# Patient Record
Sex: Female | Born: 1940 | Race: White | Hispanic: No | Marital: Married | Smoking: Never smoker
Health system: Southern US, Community
[De-identification: ages and names within clinical notes are randomized; demographics above are authoritative.]

## PROBLEM LIST (undated history)

## (undated) DIAGNOSIS — K635 Polyp of colon: Secondary | ICD-10-CM

## (undated) DIAGNOSIS — K649 Unspecified hemorrhoids: Secondary | ICD-10-CM

## (undated) HISTORY — PX: CATARACT EXTRACTION: SUR2

## (undated) HISTORY — DX: Polyp of colon: K63.5

## (undated) HISTORY — DX: Unspecified hemorrhoids: K64.9

---

## 1996-02-28 HISTORY — PX: ABDOMINAL HYSTERECTOMY: SHX81

## 1997-06-17 ENCOUNTER — Other Ambulatory Visit: Admission: RE | Admit: 1997-06-17 | Discharge: 1997-06-17 | Payer: Self-pay | Admitting: Obstetrics & Gynecology

## 1999-06-27 ENCOUNTER — Other Ambulatory Visit: Admission: RE | Admit: 1999-06-27 | Discharge: 1999-06-27 | Payer: Self-pay | Admitting: Obstetrics & Gynecology

## 2000-07-10 ENCOUNTER — Other Ambulatory Visit: Admission: RE | Admit: 2000-07-10 | Discharge: 2000-07-10 | Payer: Self-pay | Admitting: Family Medicine

## 2001-08-05 ENCOUNTER — Other Ambulatory Visit: Admission: RE | Admit: 2001-08-05 | Discharge: 2001-08-05 | Payer: Self-pay | Admitting: Obstetrics & Gynecology

## 2002-10-09 ENCOUNTER — Other Ambulatory Visit: Admission: RE | Admit: 2002-10-09 | Discharge: 2002-10-09 | Payer: Self-pay | Admitting: Obstetrics & Gynecology

## 2003-11-11 ENCOUNTER — Other Ambulatory Visit: Admission: RE | Admit: 2003-11-11 | Discharge: 2003-11-11 | Payer: Self-pay | Admitting: Obstetrics & Gynecology

## 2003-12-30 ENCOUNTER — Ambulatory Visit: Payer: Self-pay | Admitting: Internal Medicine

## 2004-01-19 ENCOUNTER — Ambulatory Visit: Payer: Self-pay | Admitting: Internal Medicine

## 2004-04-27 ENCOUNTER — Ambulatory Visit (HOSPITAL_BASED_OUTPATIENT_CLINIC_OR_DEPARTMENT_OTHER): Admission: RE | Admit: 2004-04-27 | Discharge: 2004-04-27 | Payer: Self-pay | Admitting: Orthopedic Surgery

## 2004-04-27 ENCOUNTER — Ambulatory Visit (HOSPITAL_COMMUNITY): Admission: RE | Admit: 2004-04-27 | Discharge: 2004-04-27 | Payer: Self-pay | Admitting: Orthopedic Surgery

## 2004-04-27 ENCOUNTER — Encounter (INDEPENDENT_AMBULATORY_CARE_PROVIDER_SITE_OTHER): Payer: Self-pay | Admitting: Specialist

## 2004-11-16 ENCOUNTER — Other Ambulatory Visit: Admission: RE | Admit: 2004-11-16 | Discharge: 2004-11-16 | Payer: Self-pay | Admitting: Obstetrics & Gynecology

## 2006-03-19 ENCOUNTER — Encounter: Admission: RE | Admit: 2006-03-19 | Discharge: 2006-03-19 | Payer: Self-pay | Admitting: Obstetrics & Gynecology

## 2009-06-11 ENCOUNTER — Encounter (INDEPENDENT_AMBULATORY_CARE_PROVIDER_SITE_OTHER): Payer: Self-pay | Admitting: *Deleted

## 2009-07-09 DIAGNOSIS — K648 Other hemorrhoids: Secondary | ICD-10-CM | POA: Insufficient documentation

## 2009-07-15 ENCOUNTER — Ambulatory Visit: Payer: Self-pay | Admitting: Internal Medicine

## 2009-07-15 DIAGNOSIS — M129 Arthropathy, unspecified: Secondary | ICD-10-CM | POA: Insufficient documentation

## 2009-07-15 DIAGNOSIS — K589 Irritable bowel syndrome without diarrhea: Secondary | ICD-10-CM | POA: Insufficient documentation

## 2009-07-20 LAB — CONVERTED CEMR LAB: Tissue Transglutaminase Ab, IgA: 2.1 units (ref <20)

## 2010-03-29 NOTE — Assessment & Plan Note (Signed)
Summary: hemorroids--ch.                            (11:15 appt)   History of Present Illness Visit Type: Initial Consult Primary GI MD: Lina Sar MD Primary Provider: Sigmund Hazel, MD Requesting Provider: Varney Baas , MD Chief Complaint: Patient complains about alot of gas an rumbling in her stomach. She also complains of a small hemorrhoid that does bleed from time to time. She complains of some rectal fullness and pressure but she denies constipation or diarrhea.  History of Present Illness:   This is a 70 year old white female complaining of excessive  gurgling and a lot of noise in her stomach. It becomes a problem during [employer] where there is a long silence and she's embarrassed by the noises coming from her  stomach .She denies any abdominal pain. Her bowel habits are regular. There is no nausea, vomiting, heartburn or weight changes. Her diet has been healthy. She has a history of internal hemorrhoids and is status post colonoscopy in November 2005 showing grade 2 hemorrhoids. He denies lactose intolerance or wheat intolerance.   GI Review of Systems    Reports belching and  bloating.      Denies abdominal pain, acid reflux, chest pain, dysphagia with liquids, dysphagia with solids, heartburn, loss of appetite, nausea, vomiting, vomiting blood, weight loss, and  weight gain.      Reports hemorrhoids, rectal bleeding, and  rectal pain.     Denies anal fissure, black tarry stools, change in bowel habit, constipation, diarrhea, diverticulosis, fecal incontinence, heme positive stool, irritable bowel syndrome, jaundice, light color stool, and  liver problems. Preventive Screening-Counseling & Management  Alcohol-Tobacco     Smoking Status: never    Current Medications (verified): 1)  Premarin 0.625 Mg/gm Crea (Estrogens, Conjugated) .... Apply As Directed 2)  Triamterene-Hctz 37.5-25 Mg Tabs (Triamterene-Hctz) .... Take 1 Tablet By Mouth Once A Day 3)  Calcium 1200-1000  Mg-Unit Chew (Calcium Carbonate-Vit D-Min) .... Take Daily 4)  Fish Oil 1000 Mg/omega 3 300 Mg Capsule .... Take 1 Capsule By Mouth Once A Day 5)  Fiber Diet  Tabs (Fiber) .... Take Two Tabs By Mouth Once Daily 6)  Multivitamins  Tabs (Multiple Vitamin) .... Take 1 Tablet By Mouth Once A Day 7)  Vitamin D3 2000 Unit Caps (Cholecalciferol) .... Take 1 Capsule By Mouth Once A Day 8)  Aspirin 81 Mg Tbec (Aspirin) .... Take 1 Tablet By Mouth Once A Day 9)  Ferrous Sulfate 325 (65 Fe) Mg Tabs (Ferrous Sulfate) .... Take One By Mouth Once Daily 10)  Ginkgo Biloba 120 Mg Caps (Ginkgo Biloba) .... Take One By Mouth Once Daily 11)  Acai Berry 500 Mg Caps (Acai) .... Take Two By Mouth Once Daily 12)  Beano  Tabs (Alpha-D-Galactosidase) .... Take Two Tabs By Mouth Sundays 13)  Phillips Colon Health  Caps (Probiotic Product) .... 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 C-Section Hysterectomy  Family History: Family History of Breast Cancer: Mother, Aunt, neice Family History of Colon Cancer: Paternal Aunt Family History of Diabetes: sister, neice Family History of Heart Disease: Brother, Actor, Father Family History of Liver Cancer: Aunt  Social History: retired married with one son Alcohol Use - yes  wine once every 3 weeks Illicit Drug Use - no Daily Caffeine Use 2 per day Patient has never smoked.  Smoking Status:  never  Review of Systems       The patient complains of allergy/sinus, arthritis/joint pain, fatigue, hearing problems, muscle pains/cramps, and swelling of feet/legs.  The patient denies anemia, anxiety-new, back pain, blood in urine, breast changes/lumps, change in vision, confusion, cough, coughing up blood,  depression-new, fainting, fever, headaches-new, heart murmur, heart rhythm changes, itching, menstrual pain, night sweats, nosebleeds, pregnancy symptoms, shortness of breath, skin rash, sleeping problems, sore throat, swollen lymph glands, thirst - excessive , urination - excessive , urination changes/pain, urine leakage, vision changes, and voice change.         Pertinent positive and negative review of systems were noted in the above HPI. All other ROS was otherwise negative.   Vital Signs:  Patient profile:   70 year old female Height:      63 inches Weight:      136.6 pounds BMI:     24.29 Pulse rate:   64 / minute Pulse rhythm:   regular BP sitting:   110 / 70  (right arm) Cuff size:   regular  Vitals Entered By: Harlow Mares CMA Duncan Dull) (Jul 15, 2009 11:32 AM)  Physical Exam  General:  Well developed, well nourished, no acute distress. Mouth:  No deformity or lesions, dentition normal. Neck:  Supple; no masses or thyromegaly. Lungs:  Clear throughout to auscultation. Heart:  Regular rate and rhythm; no murmurs, rubs,  or bruits. Abdomen:  normoactive bowel sounds. No abnormal sounds. No tenderness. No distention. No tympany Rectal:  external hemorrhoids stool is soft Hemoccult negative Extremities:  No clubbing, cyanosis, edema or deformities noted. Skin:  Intact without significant lesions or rashes. Psych:  Alert and cooperative. Normal mood and affect.   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 I am not sure of the etiology of it but there are several suggestions. One is to decrease her caffeine intake in the morning which stimulates acid production  as well as peristalsis. We have also given her samples of Prilosec 20 mg daily for 10 days . We will also give her a trial of Bentyl 20 mg to take in the morning before the  meeting to decrease her small bowel motility. She has already tried probiotics. She denies any other associated symptoms such as abdominal pain or weight changes. We will check her tissue transglutaminase. 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 She may need surgical intervention in the future but currently, her hemorrhoids are under good control. Orders: TLB-TSH (Thyroid Stimulating Hormone) (84443-TSH) T-Tissue Transglutamase Ab IgA ([id number])  Patient Instructions: 1)  high-fiber diet. 2)  Reduce lactulose. 3)  Continue probiotic. 4)  Trial of Prilosec 20 mg daily for 10 days. 5)  Bentyl 20 mg p.o. q.a.m. p.r.n. hyperactive bowel sounds. 6)  Tissue transglutaminase and TSH. 7)  The medication list was reviewed and reconciled.  All changed / newly prescribed medications were explained.  A complete medication list was provided to the patient / caregiver. Prescriptions: BENTYL 20 MG TABS (DICYCLOMINE HCL) Take 1 tablet by mouth every day as needed  #30 x 1   Entered by:   Lamona Curl CMA (AAMA)   Authorized by:   Hart Carwin MD  Signed by:   Lamona Curl CMA (AAMA) on 07/15/2009   Method used:   Electronically to        [facility] [phone]* (retail)       [address]       [place], [place]  [address]       Ph: [phone]       Fax: [phone]   RxID:   [id number]

## 2010-03-29 NOTE — Letter (Signed)
Summary: New Patient letter  [facility]  [address] [place], [place] [address]   Phone: [phone]  Fax: [phone]       06/11/2009 MRN: [id number]  Jaime Lindsey [address] [place], [place]  [address]  Dear Ms. Jaime Lindsey,  Welcome to the Gastroenterology Division at [facility].    You are scheduled to see Dr. Juanda Chance on 08-02-09 at 1:30p.m. on the 3rd floor at [facility], [address]  We ask that you try to arrive at our office 15 minutes prior to your appointment time to allow for check-in.  We would like you to complete the enclosed self-administered evaluation form prior to your visit and bring it with you on the day of your appointment.  We will review it with you.  Also, please bring a complete list of all your medications or, if you prefer, bring the medication bottles and we will list them.  Please bring your insurance card so that we may make a copy of it.  If your insurance requires a referral to see a specialist, please bring your referral form from your primary care physician.  Co-payments are due at the time of your visit and may be paid by cash, check or credit card.     Your office visit will consist of a consult with your physician (includes a physical exam), any laboratory testing he/she may order, scheduling of any necessary diagnostic testing (e.g. x-ray, ultrasound, CT-scan), and scheduling of a procedure (e.g. Endoscopy, Colonoscopy) if required.  Please allow enough time on your schedule to allow for any/all of these possibilities.    If you cannot keep your appointment, please call [phone] to cancel or reschedule prior to your appointment date.  This allows [place] the opportunity to schedule an appointment for another patient in need of care.  If you do not cancel or reschedule by 5 p.m. the business day prior to your appointment date, you will be charged a $50.00 late cancellation/no-show fee.    Thank you for choosing  [facility] for your medical needs.  We appreciate the opportunity to care for you.  Please visit [place] at our website  to learn more about our practice.                     Sincerely,                                                             The Gastroenterology Division

## 2010-03-29 NOTE — Procedures (Signed)
Summary: COLON   Colonoscopy  Procedure date:  01/19/2004  Findings:      Location:  [facility].    Procedures Next Due Date:    Colonoscopy: 01/2014 Patient Name: Jaime Lindsey, Jaime Lindsey MRN:  Procedure Procedures: Colonoscopy CPT: 16109.    with biopsy. CPT: Q5068410.  Personnel: Endoscopist: Chaunice Obie L. Juanda Chance, MD.  Referred By: Freddy Finner, M.D.  Exam Location: Exam performed in Outpatient Clinic. Outpatient  Patient Consent: Procedure, Alternatives, Risks and Benefits discussed, consent obtained, from patient. Consent was obtained by the RN.  Indications Symptoms: Abdominal pain / bloating. Change in bowel habits.  History  Current Medications: Patient is not currently taking Coumadin.  Pre-Exam Physical: Performed Jan 19, 2004. Entire physical exam was normal.  Exam Exam: Extent of exam reached: Ileum, extent intended: Cecum.  The cecum was identified by appendiceal orifice and IC valve. Colon retroflexion performed. Images taken. ASA Classification: I. Tolerance: good.  Monitoring: Pulse and BP monitoring, Oximetry used. Supplemental O2 given.  Colon Prep Used Miralax for colon prep. Prep results: good.  Sedation Meds: Patient assessed and found to be appropriate for moderate (conscious) sedation. Fentanyl 100 mcg. given IV. Versed 10 mg. given IV.  Findings - HEMORRHOIDS: Internal. Size: Grade II. ICD9: Hemorrhoids, Internal: 455.0. Comments: prolapsing hemorrhoid with some rectal tissue, mucosal hemorrhages.   Assessment Abnormal examination, see findings above.  Diagnoses: 455.0: Hemorrhoids, Internal.   Comments: second degree hemorrhoids Events  Unplanned Interventions: No intervention was required.  Unplanned Events: There were no complications. Plans Medication Plan: Hemorrhoidal Medications: Anusol Suppositories 1 HS, starting Jan 19, 2004  Hemorrhoidal Medications: Benefiber 1 Tablesp, starting Jan 19, 2004   Patient  Education: Patient given standard instructions for: Yearly hemoccult testing recommended. Patient instructed to get routine colonoscopy every 10 years.  Disposition: After procedure patient sent to recovery. After recovery patient sent home.   This report was created from the original endoscopy report, which was reviewed and signed by the above listed endoscopist.

## 2010-07-15 NOTE — Op Note (Signed)
NAMESHAUNESSY, DOBRATZ NO.:  [id number]   MEDICAL RECORD NO.:  [id number]          PATIENT TYPE:  AMB   LOCATION:  [facility]                          FACILITY:  [facility]   PHYSICIAN:  Cindee Salt, M.D.       DATE OF BIRTH:  1940/03/29   DATE OF PROCEDURE:  04/27/2004  DATE OF DISCHARGE:                                 OPERATIVE REPORT   PREOPERATIVE DIAGNOSIS:  Mucoid cyst, right middle finger.   POSTOPERATIVE DIAGNOSIS:  Mucoid cyst, right middle finger.   OPERATION:  Excision cyst, debridement of distal phalangeal joint, right  middle finger.   SURGEON:  Cindee Salt, M.D.   ASSISTANTCarolyne Fiscal.   ANESTHESIA:  Forearm based IV regional.   INDICATIONS FOR PROCEDURE:  The patient is a 70 year old female with a  history of a mucoid cyst, right middle finger. This has some caused  translucency of the skin in a small punctate area.   PROCEDURE:  The patient is brought to the operating room where a forearm-  based IV regional anesthetic was carried out without difficulty. She was  prepped using DuraPrep in the supine position with the right arm free. A  curvilinear incision was made over the distal interphalangeal joint carried  down through subcutaneous tissue. The incision made just proximal to the  area of the cyst. This was identified, undermined, and the cyst excised.  The joint was opened.  A large exostosis was removed from the proximal  phalanx. A significant synovitis with cyst formation radially and ulnarly  were removed. No further lesions were identified. The wound was copiously  irrigated with saline. The skin was then closed with interrupted #5-0 nylon  sutures. A sterile compressive dressing and splint were applied.   The patient tolerated the procedure well was taken to the recovery room for  observation, in satisfactory condition.   DISPOSITION:  She is discharged home, to return to the [facility] in one week, on Vicodin.      GK/MEDQ   D:  04/27/2004  T:  04/27/2004  Job:  [id number]

## 2012-04-04 ENCOUNTER — Other Ambulatory Visit: Payer: Self-pay | Admitting: Family Medicine

## 2012-04-04 ENCOUNTER — Other Ambulatory Visit: Payer: Self-pay | Admitting: Obstetrics & Gynecology

## 2012-04-04 DIAGNOSIS — Z1231 Encounter for screening mammogram for malignant neoplasm of breast: Secondary | ICD-10-CM

## 2012-05-14 ENCOUNTER — Ambulatory Visit: Payer: Self-pay

## 2012-11-06 ENCOUNTER — Other Ambulatory Visit: Payer: Self-pay | Admitting: Dermatology

## 2013-10-30 ENCOUNTER — Encounter: Payer: Self-pay | Admitting: Internal Medicine

## 2014-06-02 ENCOUNTER — Encounter: Payer: Self-pay | Admitting: Internal Medicine

## 2014-06-30 ENCOUNTER — Other Ambulatory Visit: Payer: Self-pay | Admitting: Obstetrics & Gynecology

## 2014-07-06 ENCOUNTER — Other Ambulatory Visit: Payer: Self-pay | Admitting: Obstetrics & Gynecology

## 2014-07-06 DIAGNOSIS — N6320 Unspecified lump in the left breast, unspecified quadrant: Secondary | ICD-10-CM

## 2014-07-09 ENCOUNTER — Ambulatory Visit
Admission: RE | Admit: 2014-07-09 | Discharge: 2014-07-09 | Disposition: A | Discharge status: Home / Self Care | Payer: Medicare Other | Source: Ambulatory Visit | Attending: Obstetrics & Gynecology | Admitting: Obstetrics & Gynecology

## 2014-07-09 DIAGNOSIS — N6320 Unspecified lump in the left breast, unspecified quadrant: Secondary | ICD-10-CM

## 2015-02-04 ENCOUNTER — Other Ambulatory Visit: Payer: Self-pay | Admitting: Obstetrics & Gynecology

## 2015-02-04 DIAGNOSIS — N6320 Unspecified lump in the left breast, unspecified quadrant: Secondary | ICD-10-CM

## 2015-02-10 ENCOUNTER — Ambulatory Visit
Admission: RE | Admit: 2015-02-10 | Discharge: 2015-02-10 | Disposition: A | Discharge status: Home / Self Care | Payer: Medicare Other | Source: Ambulatory Visit | Attending: Obstetrics & Gynecology | Admitting: Obstetrics & Gynecology

## 2015-02-10 DIAGNOSIS — N6320 Unspecified lump in the left breast, unspecified quadrant: Secondary | ICD-10-CM

## 2015-06-08 ENCOUNTER — Other Ambulatory Visit: Payer: Self-pay

## 2015-06-08 DIAGNOSIS — Z1231 Encounter for screening mammogram for malignant neoplasm of breast: Secondary | ICD-10-CM

## 2015-06-30 ENCOUNTER — Ambulatory Visit
Admission: RE | Admit: 2015-06-30 | Discharge: 2015-06-30 | Disposition: A | Discharge status: Home / Self Care | Payer: Medicare Other | Source: Ambulatory Visit

## 2015-06-30 DIAGNOSIS — Z1231 Encounter for screening mammogram for malignant neoplasm of breast: Secondary | ICD-10-CM

## 2017-08-22 ENCOUNTER — Other Ambulatory Visit: Payer: Self-pay | Admitting: Obstetrics & Gynecology

## 2017-08-22 DIAGNOSIS — R928 Other abnormal and inconclusive findings on diagnostic imaging of breast: Secondary | ICD-10-CM

## 2017-08-23 ENCOUNTER — Other Ambulatory Visit: Payer: Self-pay | Admitting: Obstetrics & Gynecology

## 2017-08-23 ENCOUNTER — Ambulatory Visit
Admission: RE | Admit: 2017-08-23 | Discharge: 2017-08-23 | Disposition: A | Discharge status: Home / Self Care | Payer: Medicare Other | Source: Ambulatory Visit | Attending: Obstetrics & Gynecology | Admitting: Obstetrics & Gynecology

## 2017-08-23 DIAGNOSIS — R921 Mammographic calcification found on diagnostic imaging of breast: Secondary | ICD-10-CM

## 2017-08-23 DIAGNOSIS — R928 Other abnormal and inconclusive findings on diagnostic imaging of breast: Secondary | ICD-10-CM

## 2017-09-06 ENCOUNTER — Ambulatory Visit
Admission: RE | Admit: 2017-09-06 | Discharge: 2017-09-06 | Disposition: A | Discharge status: Home / Self Care | Payer: Medicare Other | Source: Ambulatory Visit | Attending: Obstetrics & Gynecology | Admitting: Obstetrics & Gynecology

## 2017-09-06 DIAGNOSIS — R921 Mammographic calcification found on diagnostic imaging of breast: Secondary | ICD-10-CM

## 2017-10-08 ENCOUNTER — Ambulatory Visit: Payer: Medicare Other | Admitting: Cardiology

## 2017-10-08 ENCOUNTER — Encounter: Payer: Self-pay | Admitting: Cardiology

## 2017-10-08 DIAGNOSIS — I10 Essential (primary) hypertension: Secondary | ICD-10-CM | POA: Diagnosis not present

## 2017-10-08 DIAGNOSIS — R5383 Other fatigue: Secondary | ICD-10-CM

## 2017-10-08 DIAGNOSIS — R001 Bradycardia, unspecified: Secondary | ICD-10-CM | POA: Insufficient documentation

## 2017-10-08 NOTE — Assessment & Plan Note (Signed)
R/O cardiac etiology

## 2017-10-08 NOTE — Patient Instructions (Signed)
Medication Instructions: Your physician recommends that you continue on your current medications as directed. Please refer to the Current Medication list given to you today.   Testing/Procedures: Your physician has requested that you have an exercise stress myoview. For further information please visit [email or web address]. Please follow instruction sheet, as given.  Follow-Up: Your physician recommends that you schedule a follow-up appointment with Dr. Stanford Breed after testing.

## 2017-10-08 NOTE — Progress Notes (Signed)
10/08/2017 Jaime Lindsey   04/07/1940  [id number]  Primary Physician Kathyrn Lass, MD Primary Cardiologist: Dr Stanford Breed (new)  HPI:  Pleasant 77 y/o female referred to [place] for evaluation  of fatigue. The pt has no history of CAD or prior cardiac work up. She has HTN which is treated.  She has noticed general fatigue over the pat 6-8 months. She exercises each morning using hand weights and stretches without difficulty. During the day routine chores such as shopping and laundry cause significant fatigue. She has had rare near syncope, twice in the last 6 months. Theses episodes may have been associated with tachycardia. She denies any chest pain or DOE.    Current Outpatient Medications  Medication Sig Dispense Refill  . Calcium Carbonate-Vit D-Min (CALCIUM 1200 PO) Take 1 tablet by mouth daily.    . Cholecalciferol (VITAMIN D3) 2000 units TABS Take 1 tablet by mouth daily.    Marland Kitchen estradiol (ESTRACE) 0.5 MG tablet Take 1 mg by mouth daily.   3  . Ferrous Sulfate (IRON) 325 (65 Fe) MG TABS Take 1 tablet by mouth daily.    . Inulin (FIBER CHOICE PO) Take 1 tablet by mouth daily.    . Multiple Vitamin (MULTIVITAMIN) tablet Take 1 tablet by mouth daily.    Marland Kitchen triamterene-hydrochlorothiazide (DYAZIDE) 37.5-25 MG capsule Take 1 capsule by mouth daily.    . vitamin B-12 (CYANOCOBALAMIN) 1000 MCG tablet Take 1,000 mcg by mouth daily.     No current facility-administered medications for this visit.     Not on File  Past Medical History:  Diagnosis Date  . Colonic polyp   . Hemorrhoids     Social History   Socioeconomic History  . Marital status: Married    Spouse name: Not on file  . Number of children: Not on file  . Years of education: Not on file  . Highest education level: Not on file  Occupational History  . Not on file  Social Needs  . Financial resource strain: Not on file  . Food insecurity:    Worry: Not on file    Inability: Not on file  . Transportation needs:   Medical: Not on file    Non-medical: Not on file  Tobacco Use  . Smoking status: Never Smoker  . Smokeless tobacco: Never Used  Substance and Sexual Activity  . Alcohol use: Not on file  . Drug use: Never  . Sexual activity: Not on file  Lifestyle  . Physical activity:    Days per week: Not on file    Minutes per session: Not on file  . Stress: Not on file  Relationships  . Social connections:    Talks on phone: Not on file    Gets together: Not on file    Attends religious service: Not on file    Active member of club or organization: Not on file    Attends meetings of clubs or organizations: Not on file    Relationship status: Not on file  . Intimate partner violence:    Fear of current or ex partner: Not on file    Emotionally abused: Not on file    Physically abused: Not on file    Forced sexual activity: Not on file  Other Topics Concern  . Not on file  Social History Narrative  . Not on file     Family History  Problem Relation Age of Onset  . CAD Father 61  CABG in his 77's     Review of Systems: General: negative for chills, fever, night sweats or weight changes.  Cardiovascular: negative for chest pain, dyspnea on exertion, edema, orthopnea, paroxysmal nocturnal dyspnea or shortness of breath Dermatological: negative for rash Respiratory: negative for cough or wheezing Urologic: negative for hematuria Abdominal: negative for nausea, vomiting, diarrhea, bright red blood per rectum, melena, or hematemesis Neurologic: negative for visual changes, syncope, or dizziness All other systems reviewed and are otherwise negative except as noted above.    Blood pressure 122/74, pulse 69, height 5\' 2"  (1.575 m), weight 140 lb 3.2 oz (63.6 kg), SpO2 97 %.  General appearance: alert, cooperative and no distress Neck: no carotid bruit and no JVD Lungs: clear to auscultation bilaterally Heart: regular rate and rhythm Abdomen: soft, non-tender; bowel sounds  normal; no masses,  no organomegaly Extremities: extremities normal, atraumatic, no cyanosis or edema and varicose veins noted Pulses: 2+ and symmetric Skin: Skin color, texture, turgor normal. No rashes or lesions Neurologic: Grossly normal  EKG From PCP 09/07/17- NSR-56 LAFB  ASSESSMENT AND PLAN:   Fatigue R/O cardiac etiology  Essential hypertension Controlled  Sinus bradycardia on ECG HR in the 60's today   PLAN  I suggested we proceed with a GXT Myoview. She can f/u with Dr Stanford Breed after this.   Kerin Ransom PA-C 10/08/2017 3:35 PM

## 2017-10-08 NOTE — Assessment & Plan Note (Signed)
HR in the 60's today

## 2017-10-08 NOTE — Assessment & Plan Note (Signed)
Controlled.  

## 2017-10-10 ENCOUNTER — Telehealth (HOSPITAL_COMMUNITY): Payer: Self-pay

## 2017-10-10 NOTE — Telephone Encounter (Signed)
Encounter complete. 

## 2017-10-11 ENCOUNTER — Ambulatory Visit (HOSPITAL_COMMUNITY)
Admission: RE | Admit: 2017-10-11 | Discharge: 2017-10-11 | Disposition: A | Discharge status: Home / Self Care | Payer: Medicare Other | Source: Ambulatory Visit | Attending: Cardiology | Admitting: Cardiology

## 2017-10-11 ENCOUNTER — Encounter: Payer: Self-pay | Admitting: Cardiology

## 2017-10-11 ENCOUNTER — Encounter (HOSPITAL_COMMUNITY): Payer: Self-pay

## 2017-10-11 DIAGNOSIS — R001 Bradycardia, unspecified: Secondary | ICD-10-CM | POA: Insufficient documentation

## 2017-10-11 DIAGNOSIS — R5383 Other fatigue: Secondary | ICD-10-CM | POA: Insufficient documentation

## 2017-10-12 ENCOUNTER — Telehealth (HOSPITAL_COMMUNITY): Payer: Self-pay

## 2017-10-12 NOTE — Telephone Encounter (Signed)
Encounter complete. 

## 2017-10-16 ENCOUNTER — Ambulatory Visit (HOSPITAL_COMMUNITY)
Admission: RE | Admit: 2017-10-16 | Discharge: 2017-10-16 | Disposition: A | Discharge status: Home / Self Care | Payer: Medicare Other | Source: Ambulatory Visit | Attending: Cardiovascular Disease | Admitting: Cardiovascular Disease

## 2017-10-16 DIAGNOSIS — R5383 Other fatigue: Secondary | ICD-10-CM | POA: Diagnosis present

## 2017-10-16 DIAGNOSIS — R001 Bradycardia, unspecified: Secondary | ICD-10-CM | POA: Insufficient documentation

## 2017-10-16 LAB — MYOCARDIAL PERFUSION IMAGING
Peak HR: 82 {beats}/min
Rest HR: 59 {beats}/min
SDS: 2
SRS: 1
SSS: 3
TID: 1

## 2017-10-16 MED ORDER — TECHNETIUM TC 99M TETROFOSMIN IV KIT
9.9000 | PACK | Freq: Once | INTRAVENOUS | Status: DC | PRN
  Filled 2017-10-16: qty 10

## 2017-10-16 MED ORDER — TECHNETIUM TC 99M TETROFOSMIN IV KIT
30.5000 | PACK | Freq: Once | INTRAVENOUS | Status: DC | PRN
  Filled 2017-10-16: qty 31

## 2017-10-16 MED ORDER — REGADENOSON 0.4 MG/5ML IV SOLN: 0.4000 mg | Freq: Once | INTRAVENOUS | Status: DC

## 2017-12-03 NOTE — Progress Notes (Signed)
HPI: Follow-up fatigue.  Laboratories July 2019 showed mildly elevated TSH at 5.58 and normal hemoglobin.  Seen by Kerin Ransom October 08, 2017 with this complaint.  Had stress nuclear study October 16, 2017.  Study not gated because of frequent PVCs.  Perfusion normal. Since last seen, patient denies dyspnea, orthopnea, PND, pedal edema, chest pain or syncope.  Occasional skip.  Continues with fatigue.  Current Outpatient Medications  Medication Sig Dispense Refill  . Calcium Carbonate-Vit D-Min (CALCIUM 1200 PO) Take 1 tablet by mouth daily.    . Cholecalciferol (VITAMIN D3) 2000 units TABS Take 1 tablet by mouth daily.    Marland Kitchen estradiol (ESTRACE) 0.5 MG tablet Take 1 mg by mouth daily.   3  . Ferrous Sulfate (IRON) 325 (65 Fe) MG TABS Take 1 tablet by mouth daily.    . Inulin (FIBER CHOICE PO) Take 1 tablet by mouth daily.    . Multiple Vitamin (MULTIVITAMIN) tablet Take 1 tablet by mouth daily.    Marland Kitchen triamterene-hydrochlorothiazide (DYAZIDE) 37.5-25 MG capsule Take 1 capsule by mouth daily.    . vitamin B-12 (CYANOCOBALAMIN) 1000 MCG tablet Take 1,000 mcg by mouth daily.     No current facility-administered medications for this visit.      Past Medical History:  Diagnosis Date  . Colonic polyp   . Hemorrhoids     Past Surgical History:  Procedure Laterality Date  . ABDOMINAL HYSTERECTOMY  1998  . CESAREAN SECTION      Social History   Socioeconomic History  . Marital status: Married    Spouse name: Not on file  . Number of children: Not on file  . Years of education: Not on file  . Highest education level: Not on file  Occupational History  . Not on file  Social Needs  . Financial resource strain: Not on file  . Food insecurity:    Worry: Not on file    Inability: Not on file  . Transportation needs:    Medical: Not on file    Non-medical: Not on file  Tobacco Use  . Smoking status: Never Smoker  . Smokeless tobacco: Never Used  Substance and Sexual Activity    . Alcohol use: Not on file  . Drug use: Never  . Sexual activity: Not on file  Lifestyle  . Physical activity:    Days per week: Not on file    Minutes per session: Not on file  . Stress: Not on file  Relationships  . Social connections:    Talks on phone: Not on file    Gets together: Not on file    Attends religious service: Not on file    Active member of club or organization: Not on file    Attends meetings of clubs or organizations: Not on file    Relationship status: Not on file  . Intimate partner violence:    Fear of current or ex partner: Not on file    Emotionally abused: Not on file    Physically abused: Not on file    Forced sexual activity: Not on file  Other Topics Concern  . Not on file  Social History Narrative  . Not on file    Family History  Problem Relation Age of Onset  . CAD Father 42       CABG in his 62's    ROS: no fevers or chills, productive cough, hemoptysis, dysphasia, odynophagia, melena, hematochezia, dysuria, hematuria, rash, seizure activity, orthopnea, PND,  pedal edema, claudication. Remaining systems are negative.  Physical Exam: Well-developed well-nourished in no acute distress.  Skin is warm and dry.  HEENT is normal.  Neck is supple.  Chest is clear to auscultation with normal expansion.  Cardiovascular exam is regular rate and rhythm.  Abdominal exam nontender or distended. No masses palpated. Extremities show no edema. neuro grossly intact   A/P  1 fatigue-etiology unclear.  Hemoglobin recently normal by report.  TSH mildly elevated.  Question of hypothyroidism is contributing.  I have asked her to follow-up with primary care.  Her nuclear study showed no ischemia but was not able to be gated due to ectopy.  I will arrange an echocardiogram to fully assess LV function.  If normal we will not pursue further cardiac evaluation.  2 hypertension-blood pressure is controlled.  Continue present medications.  Kirk Ruths,  MD

## 2017-12-17 ENCOUNTER — Ambulatory Visit: Payer: Medicare Other | Admitting: Cardiology

## 2017-12-17 ENCOUNTER — Encounter

## 2017-12-17 ENCOUNTER — Encounter: Payer: Self-pay | Admitting: Cardiology

## 2017-12-17 VITALS — BP 122/70 | HR 72 | Ht 62.0 in | Wt 142.0 lb

## 2017-12-17 DIAGNOSIS — R002 Palpitations: Secondary | ICD-10-CM | POA: Diagnosis not present

## 2017-12-17 DIAGNOSIS — I10 Essential (primary) hypertension: Secondary | ICD-10-CM | POA: Diagnosis not present

## 2017-12-17 DIAGNOSIS — R5383 Other fatigue: Secondary | ICD-10-CM

## 2017-12-17 NOTE — Patient Instructions (Signed)
Medication Instructions:  NO CHANGE If you need a refill on your cardiac medications before your next appointment, please call your pharmacy.   Lab work: If you have labs (blood work) drawn today and your tests are completely normal, you will receive your results only by: Marland Kitchen MyChart Message (if you have MyChart) OR . A paper copy in the mail If you have any lab test that is abnormal or we need to change your treatment, we will call you to review the results.  Testing/Procedures:  Your physician has requested that you have an echocardiogram. Echocardiography is a painless test that uses sound waves to create images of your heart. It provides your doctor with information about the size and shape of your heart and how well your heart's chambers and valves are working. This procedure takes approximately one hour. There are no restrictions for this procedure.    Follow-Up:  Your physician recommends that you schedule a follow-up appointment in: AS NEEDED PENDING TEST RESULTS

## 2017-12-27 ENCOUNTER — Ambulatory Visit (HOSPITAL_COMMUNITY): Payer: Medicare Other | Attending: Cardiology

## 2017-12-27 ENCOUNTER — Other Ambulatory Visit: Payer: Self-pay

## 2017-12-27 DIAGNOSIS — R002 Palpitations: Secondary | ICD-10-CM

## 2017-12-27 DIAGNOSIS — I10 Essential (primary) hypertension: Secondary | ICD-10-CM | POA: Diagnosis not present

## 2017-12-27 DIAGNOSIS — Z8249 Family history of ischemic heart disease and other diseases of the circulatory system: Secondary | ICD-10-CM | POA: Insufficient documentation

## 2019-01-30 IMAGING — MG STEREOTACTIC CORE NEEDLE BIOPSY
8 of 14 series · 8 of 22 positions shown · non-contrast
Comparison: Previous exams.

ADDENDUM:
Pathology revealed BENIGN BREAST TISSUE WITH FIBROCYSTIC CHANGE,
INCLUDING USUAL DUCTAL HYPERPLASIA AND MICROCALCIFICATIONS of LEFT
breast, lower inner quadrant, anterior. This was found to be
concordant by Dr. Donghong Ramselaar.

Pathology results were discussed with the patient by telephone. The
patient reported doing well after the biopsy with tenderness at the
site. Post biopsy instructions and care were reviewed and questions
were answered. The patient was encouraged to call The [REDACTED]
The patient was instructed to return for annual screening
mammography which the patient states she will have performed at
Pathology results reported by Rekha Yepez, RN on 09/10/2017.
CLINICAL DATA: 76-year-old female with indeterminate left breast
calcifications.
EXAM:
LEFT BREAST STEREOTACTIC CORE NEEDLE BIOPSY

[L (1 of 8)]
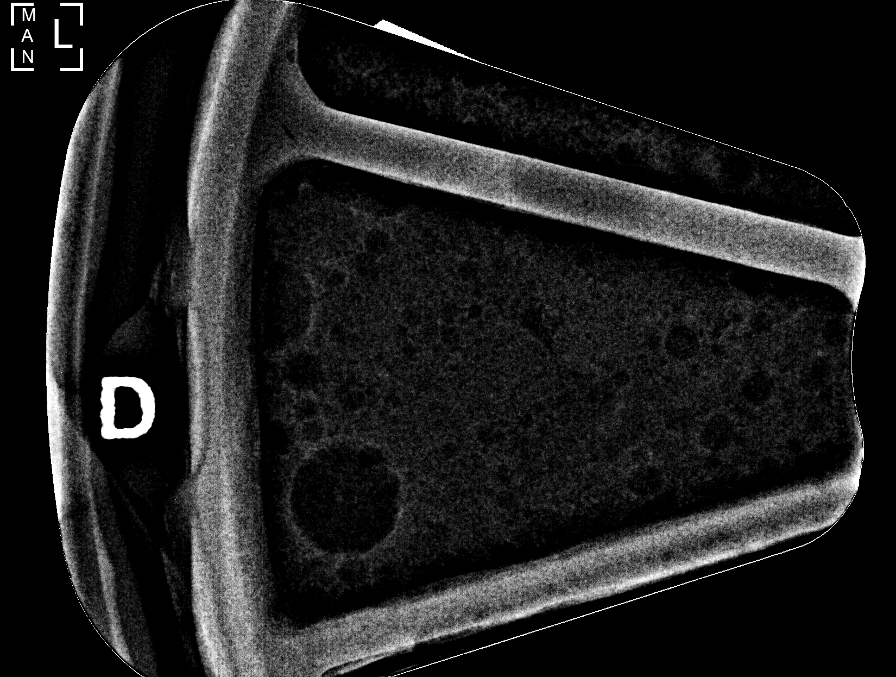

[L (2 of 8)]
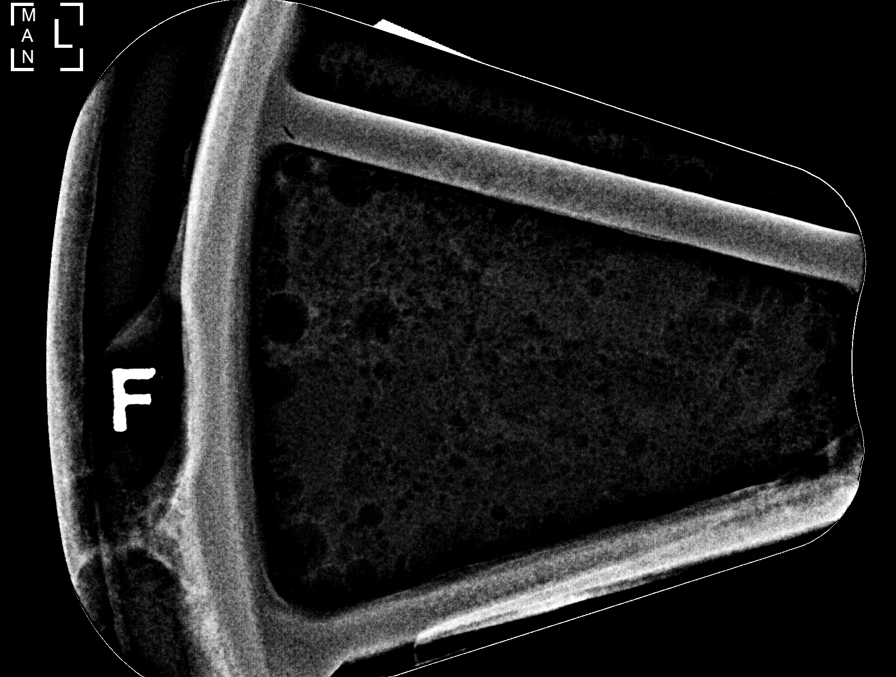

[L (3 of 8)]
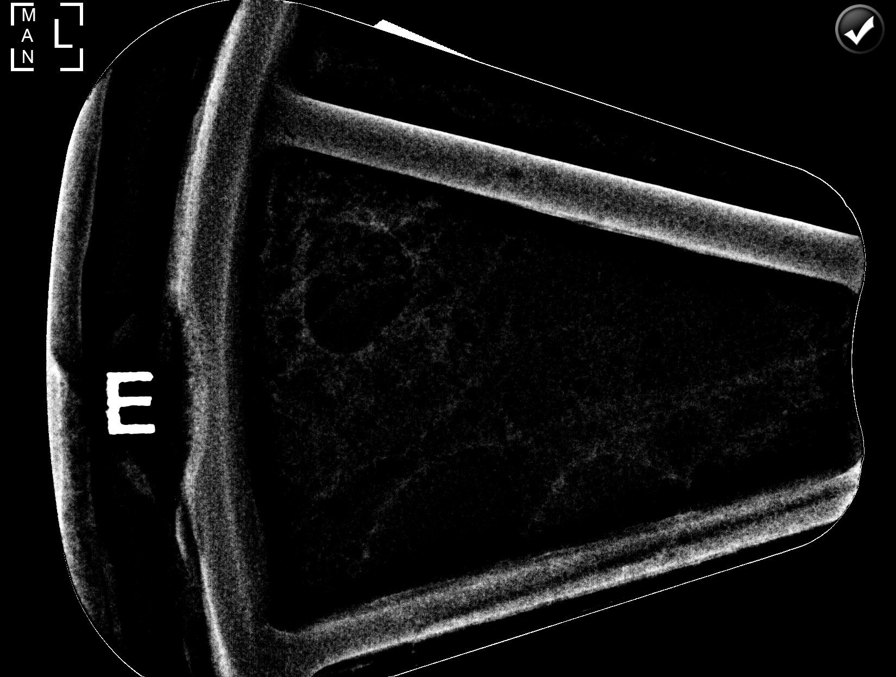

[L (4 of 8)]
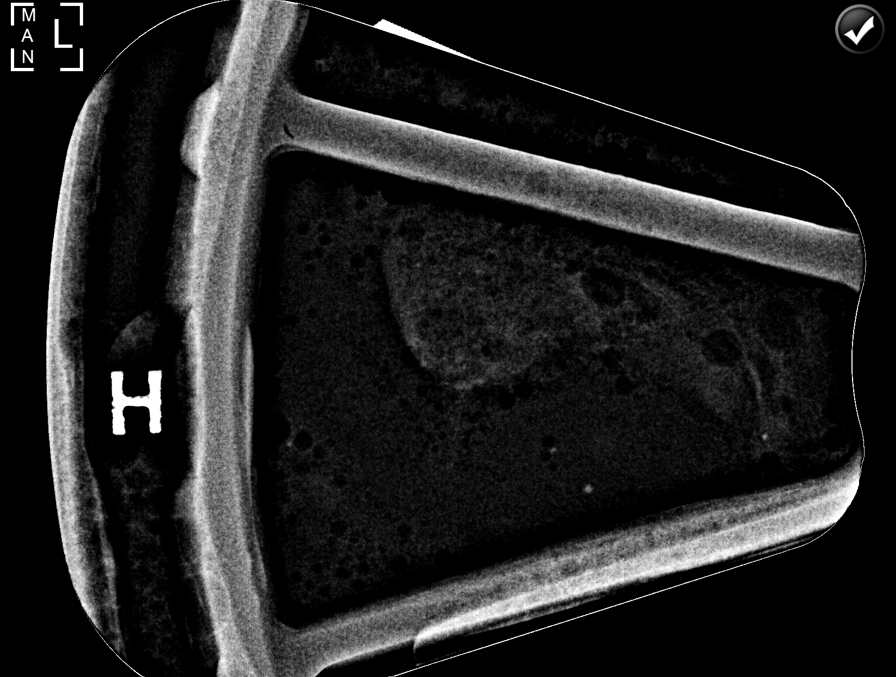

[L (5 of 8)]
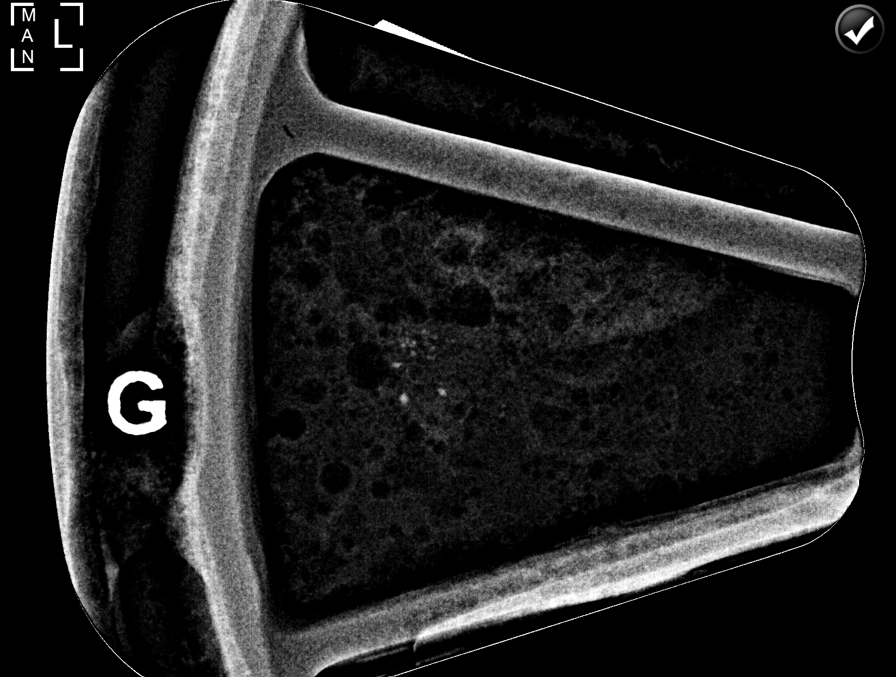

[L (6 of 8)]
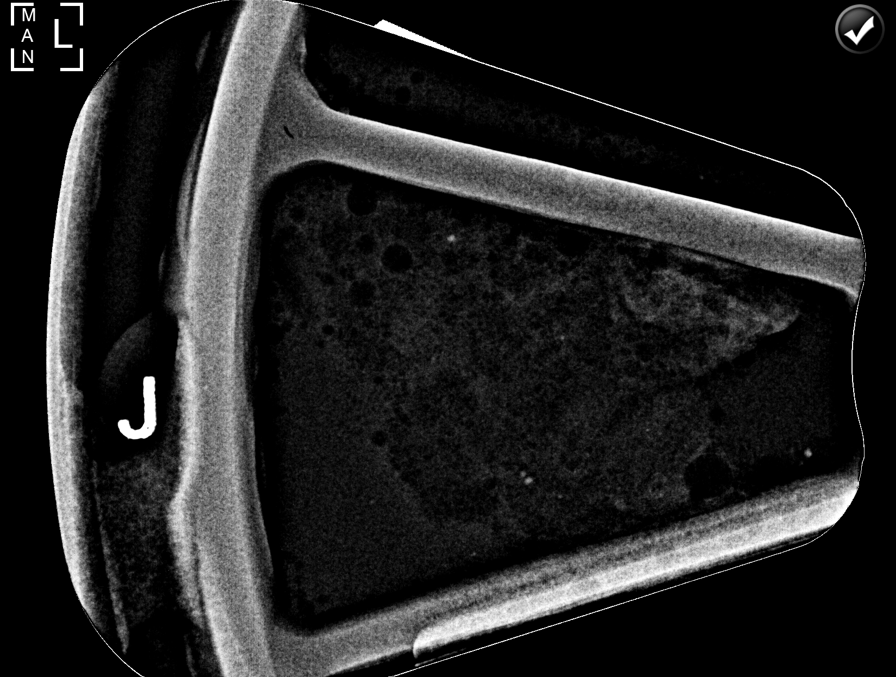

[L (7 of 8)]
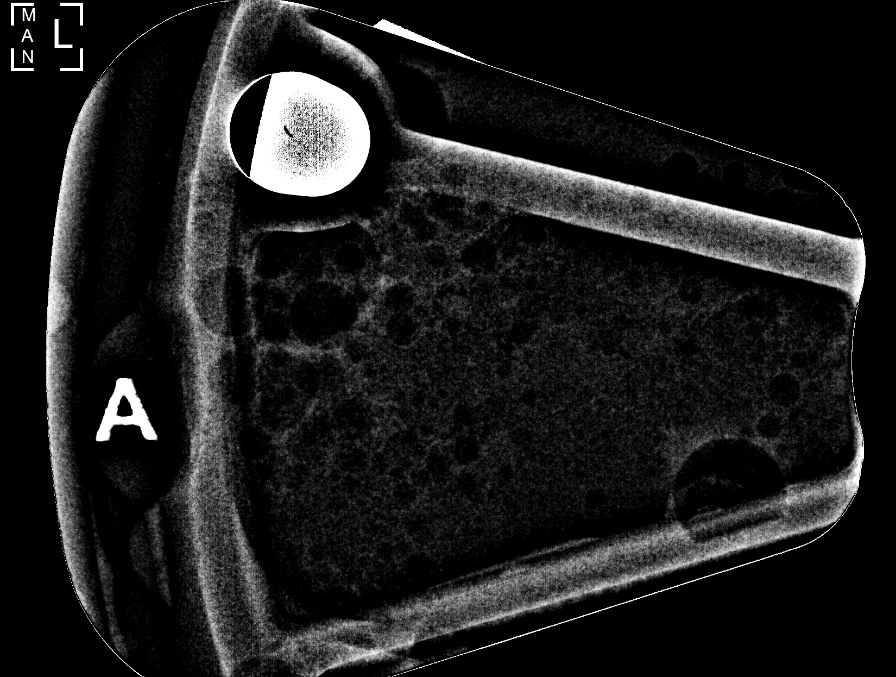

[L (8 of 8)]
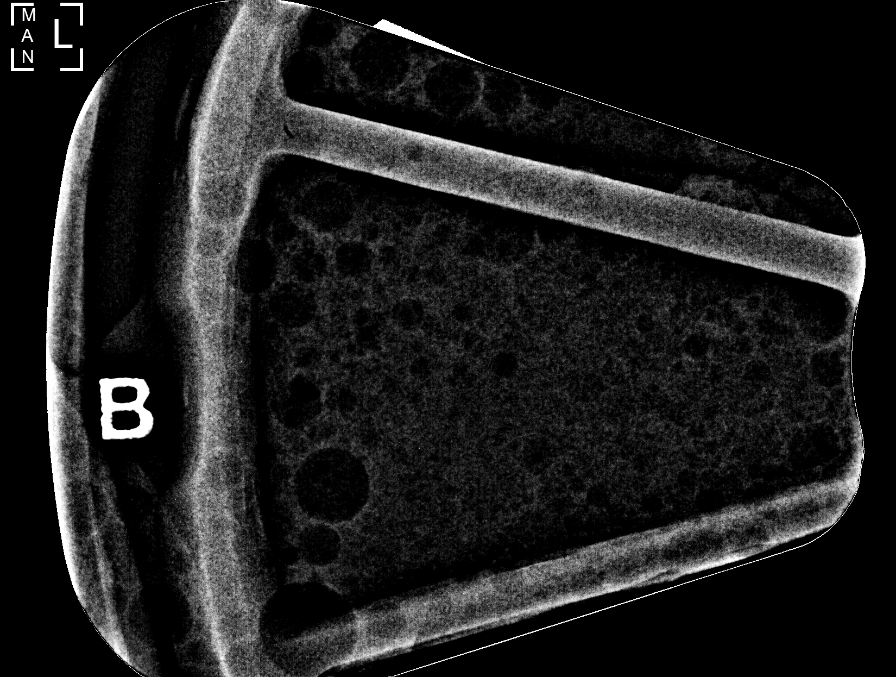

[8 of 22 positions shown; findings below may reference images not displayed]



Using sterile technique and 1% Lidocaine as local anesthetic, under
stereotactic guidance, a 9 gauge vacuum assisted device was used to
perform core needle biopsy of calcifications in the lower inner
quadrant of the left breast using a medial approach. Specimen
radiograph was performed showing calcifications in multiple
specimens. Specimens with calcifications are identified for
pathology.

Lesion quadrant: Lower inner quadrant

At the conclusion of the procedure, a coil shaped tissue marker clip
was deployed into the biopsy cavity. Follow-up 2-view mammogram was
performed and dictated separately.
IMPRESSION: Stereotactic-guided biopsy of left breast calcifications. No
apparent complications.

## 2019-03-08 ENCOUNTER — Ambulatory Visit: Payer: Medicare Other | Attending: Internal Medicine

## 2019-03-08 DIAGNOSIS — Z23 Encounter for immunization: Secondary | ICD-10-CM | POA: Insufficient documentation

## 2019-03-08 NOTE — Progress Notes (Signed)
   Covid-19 Vaccination Clinic  Name:  Jaime Lindsey    MRN: [id number] DOB: 26-May-1940  03/08/2019  [name] was observed post Covid-19 immunization for 15 minutes without incidence. She was provided with Vaccine Information Sheet and instruction to access the V-Safe system.   [name] was instructed to call 911 with any severe reactions post vaccine: Marland Kitchen Difficulty breathing  . Swelling of your face and throat  . A fast heartbeat  . A bad rash all over your body  . Dizziness and weakness    Immunizations Administered    Name Date Dose VIS Date Route   Pfizer COVID-19 Vaccine 03/08/2019 11:13 AM 0.3 mL 02/07/2019 Intramuscular   Manufacturer: [employer]   Lot: [id number]   [place]: [id number]

## 2019-03-27 ENCOUNTER — Ambulatory Visit: Payer: Medicare Other

## 2019-03-29 ENCOUNTER — Ambulatory Visit: Payer: Medicare Other | Attending: Internal Medicine

## 2019-03-29 DIAGNOSIS — Z23 Encounter for immunization: Secondary | ICD-10-CM | POA: Insufficient documentation

## 2019-03-29 NOTE — Progress Notes (Signed)
   Covid-19 Vaccination Clinic  Name:  Jaime Lindsey    MRN: [id number] DOB: December 08, 1940  03/29/2019  [name] was observed post Covid-19 immunization for 15 minutes without incidence. She was provided with Vaccine Information Sheet and instruction to access the V-Safe system.   [name] was instructed to call 911 with any severe reactions post vaccine: Marland Kitchen Difficulty breathing  . Swelling of your face and throat  . A fast heartbeat  . A bad rash all over your body  . Dizziness and weakness    Immunizations Administered    Name Date Dose VIS Date Route   Pfizer COVID-19 Vaccine 03/29/2019  9:45 AM 0.3 mL 02/07/2019 Intramuscular   Manufacturer: [place]   Lot: [id number]   [place]: [id number]

## 2020-03-04 DIAGNOSIS — H3581 Retinal edema: Secondary | ICD-10-CM | POA: Diagnosis not present

## 2020-03-04 DIAGNOSIS — H35372 Puckering of macula, left eye: Secondary | ICD-10-CM | POA: Diagnosis not present

## 2020-03-05 DIAGNOSIS — H35372 Puckering of macula, left eye: Secondary | ICD-10-CM | POA: Diagnosis not present

## 2020-03-12 DIAGNOSIS — H3581 Retinal edema: Secondary | ICD-10-CM | POA: Diagnosis not present

## 2020-03-12 DIAGNOSIS — H35373 Puckering of macula, bilateral: Secondary | ICD-10-CM | POA: Diagnosis not present

## 2020-03-12 DIAGNOSIS — H35363 Drusen (degenerative) of macula, bilateral: Secondary | ICD-10-CM | POA: Diagnosis not present

## 2020-04-02 DIAGNOSIS — H35373 Puckering of macula, bilateral: Secondary | ICD-10-CM | POA: Diagnosis not present

## 2020-04-02 DIAGNOSIS — H3581 Retinal edema: Secondary | ICD-10-CM | POA: Diagnosis not present

## 2020-04-29 DIAGNOSIS — Z0000 Encounter for general adult medical examination without abnormal findings: Secondary | ICD-10-CM | POA: Diagnosis not present

## 2020-04-30 DIAGNOSIS — N183 Chronic kidney disease, stage 3 unspecified: Secondary | ICD-10-CM | POA: Diagnosis not present

## 2020-04-30 DIAGNOSIS — I129 Hypertensive chronic kidney disease with stage 1 through stage 4 chronic kidney disease, or unspecified chronic kidney disease: Secondary | ICD-10-CM | POA: Diagnosis not present

## 2020-04-30 DIAGNOSIS — E039 Hypothyroidism, unspecified: Secondary | ICD-10-CM | POA: Diagnosis not present

## 2020-05-05 DIAGNOSIS — H35373 Puckering of macula, bilateral: Secondary | ICD-10-CM | POA: Diagnosis not present

## 2020-05-11 DIAGNOSIS — L570 Actinic keratosis: Secondary | ICD-10-CM | POA: Diagnosis not present

## 2020-05-11 DIAGNOSIS — C44311 Basal cell carcinoma of skin of nose: Secondary | ICD-10-CM | POA: Diagnosis not present

## 2020-05-18 DIAGNOSIS — H35033 Hypertensive retinopathy, bilateral: Secondary | ICD-10-CM | POA: Diagnosis not present

## 2020-05-25 DIAGNOSIS — H30042 Focal chorioretinal inflammation, macular or paramacular, left eye: Secondary | ICD-10-CM | POA: Diagnosis not present

## 2020-06-14 DIAGNOSIS — C44311 Basal cell carcinoma of skin of nose: Secondary | ICD-10-CM | POA: Diagnosis not present

## 2020-06-22 DIAGNOSIS — H30041 Focal chorioretinal inflammation, macular or paramacular, right eye: Secondary | ICD-10-CM | POA: Diagnosis not present

## 2020-06-22 DIAGNOSIS — H30043 Focal chorioretinal inflammation, macular or paramacular, bilateral: Secondary | ICD-10-CM | POA: Diagnosis not present

## 2020-08-10 DIAGNOSIS — Z79899 Other long term (current) drug therapy: Secondary | ICD-10-CM | POA: Diagnosis not present

## 2020-08-10 DIAGNOSIS — E7800 Pure hypercholesterolemia, unspecified: Secondary | ICD-10-CM | POA: Diagnosis not present

## 2020-08-20 DIAGNOSIS — H43811 Vitreous degeneration, right eye: Secondary | ICD-10-CM | POA: Diagnosis not present

## 2020-08-20 DIAGNOSIS — H30043 Focal chorioretinal inflammation, macular or paramacular, bilateral: Secondary | ICD-10-CM | POA: Diagnosis not present

## 2020-08-20 DIAGNOSIS — H35372 Puckering of macula, left eye: Secondary | ICD-10-CM | POA: Diagnosis not present

## 2020-09-06 DIAGNOSIS — H35033 Hypertensive retinopathy, bilateral: Secondary | ICD-10-CM | POA: Diagnosis not present

## 2020-10-14 DIAGNOSIS — Z1231 Encounter for screening mammogram for malignant neoplasm of breast: Secondary | ICD-10-CM | POA: Diagnosis not present

## 2020-10-19 DIAGNOSIS — H35371 Puckering of macula, right eye: Secondary | ICD-10-CM | POA: Diagnosis not present

## 2020-10-19 DIAGNOSIS — H43811 Vitreous degeneration, right eye: Secondary | ICD-10-CM | POA: Diagnosis not present

## 2020-10-19 DIAGNOSIS — H30043 Focal chorioretinal inflammation, macular or paramacular, bilateral: Secondary | ICD-10-CM | POA: Diagnosis not present

## 2020-10-19 DIAGNOSIS — H59033 Cystoid macular edema following cataract surgery, bilateral: Secondary | ICD-10-CM | POA: Diagnosis not present

## 2021-01-28 DIAGNOSIS — H26493 Other secondary cataract, bilateral: Secondary | ICD-10-CM | POA: Diagnosis not present

## 2021-01-28 DIAGNOSIS — H35371 Puckering of macula, right eye: Secondary | ICD-10-CM | POA: Diagnosis not present

## 2021-01-28 DIAGNOSIS — H30042 Focal chorioretinal inflammation, macular or paramacular, left eye: Secondary | ICD-10-CM | POA: Diagnosis not present

## 2021-01-28 DIAGNOSIS — H43811 Vitreous degeneration, right eye: Secondary | ICD-10-CM | POA: Diagnosis not present

## 2021-02-25 DIAGNOSIS — E7800 Pure hypercholesterolemia, unspecified: Secondary | ICD-10-CM | POA: Diagnosis not present

## 2021-02-25 DIAGNOSIS — I129 Hypertensive chronic kidney disease with stage 1 through stage 4 chronic kidney disease, or unspecified chronic kidney disease: Secondary | ICD-10-CM | POA: Diagnosis not present

## 2021-02-25 DIAGNOSIS — E039 Hypothyroidism, unspecified: Secondary | ICD-10-CM | POA: Diagnosis not present

## 2021-02-25 DIAGNOSIS — N183 Chronic kidney disease, stage 3 unspecified: Secondary | ICD-10-CM | POA: Diagnosis not present

## 2021-02-25 DIAGNOSIS — J069 Acute upper respiratory infection, unspecified: Secondary | ICD-10-CM | POA: Diagnosis not present

## 2021-04-13 DIAGNOSIS — U071 COVID-19: Secondary | ICD-10-CM | POA: Diagnosis not present

## 2021-04-29 DIAGNOSIS — H30042 Focal chorioretinal inflammation, macular or paramacular, left eye: Secondary | ICD-10-CM | POA: Diagnosis not present

## 2021-04-29 DIAGNOSIS — H43811 Vitreous degeneration, right eye: Secondary | ICD-10-CM | POA: Diagnosis not present

## 2021-05-24 DIAGNOSIS — H35033 Hypertensive retinopathy, bilateral: Secondary | ICD-10-CM | POA: Diagnosis not present

## 2021-07-05 DIAGNOSIS — H18413 Arcus senilis, bilateral: Secondary | ICD-10-CM | POA: Diagnosis not present

## 2021-07-05 DIAGNOSIS — H35372 Puckering of macula, left eye: Secondary | ICD-10-CM | POA: Diagnosis not present

## 2021-07-05 DIAGNOSIS — H353131 Nonexudative age-related macular degeneration, bilateral, early dry stage: Secondary | ICD-10-CM | POA: Diagnosis not present

## 2021-07-05 DIAGNOSIS — H26492 Other secondary cataract, left eye: Secondary | ICD-10-CM | POA: Diagnosis not present

## 2021-07-05 DIAGNOSIS — H26493 Other secondary cataract, bilateral: Secondary | ICD-10-CM | POA: Diagnosis not present

## 2021-07-05 DIAGNOSIS — Z961 Presence of intraocular lens: Secondary | ICD-10-CM | POA: Diagnosis not present

## 2021-07-14 DIAGNOSIS — L821 Other seborrheic keratosis: Secondary | ICD-10-CM | POA: Diagnosis not present

## 2021-07-14 DIAGNOSIS — D225 Melanocytic nevi of trunk: Secondary | ICD-10-CM | POA: Diagnosis not present

## 2021-07-14 DIAGNOSIS — Z85828 Personal history of other malignant neoplasm of skin: Secondary | ICD-10-CM | POA: Diagnosis not present

## 2021-07-14 DIAGNOSIS — L570 Actinic keratosis: Secondary | ICD-10-CM | POA: Diagnosis not present

## 2021-07-14 DIAGNOSIS — D1801 Hemangioma of skin and subcutaneous tissue: Secondary | ICD-10-CM | POA: Diagnosis not present

## 2021-07-14 DIAGNOSIS — C44622 Squamous cell carcinoma of skin of right upper limb, including shoulder: Secondary | ICD-10-CM | POA: Diagnosis not present

## 2021-07-19 DIAGNOSIS — M8588 Other specified disorders of bone density and structure, other site: Secondary | ICD-10-CM | POA: Diagnosis not present

## 2021-08-02 DIAGNOSIS — H26493 Other secondary cataract, bilateral: Secondary | ICD-10-CM | POA: Diagnosis not present

## 2021-08-02 DIAGNOSIS — H30042 Focal chorioretinal inflammation, macular or paramacular, left eye: Secondary | ICD-10-CM | POA: Diagnosis not present

## 2021-08-02 DIAGNOSIS — H35371 Puckering of macula, right eye: Secondary | ICD-10-CM | POA: Diagnosis not present

## 2021-08-02 DIAGNOSIS — H43811 Vitreous degeneration, right eye: Secondary | ICD-10-CM | POA: Diagnosis not present

## 2021-09-02 DIAGNOSIS — Z0000 Encounter for general adult medical examination without abnormal findings: Secondary | ICD-10-CM | POA: Diagnosis not present

## 2021-09-06 ENCOUNTER — Ambulatory Visit
Admission: EM | Admit: 2021-09-06 | Discharge: 2021-09-06 | Disposition: A | Discharge status: Home / Self Care | Payer: Medicare Other

## 2021-09-06 DIAGNOSIS — S50862A Insect bite (nonvenomous) of left forearm, initial encounter: Secondary | ICD-10-CM

## 2021-09-06 DIAGNOSIS — L03818 Cellulitis of other sites: Secondary | ICD-10-CM | POA: Diagnosis not present

## 2021-09-06 DIAGNOSIS — W57XXXA Bitten or stung by nonvenomous insect and other nonvenomous arthropods, initial encounter: Secondary | ICD-10-CM | POA: Diagnosis not present

## 2021-09-06 MED ORDER — SULFAMETHOXAZOLE-TRIMETHOPRIM 800-160 MG PO TABS: 1.0000 | ORAL_TABLET | Freq: Two times a day (BID) | ORAL | 0 refills | Status: AC

## 2021-09-06 MED ORDER — DIPHENHYDRAMINE HCL 25 MG PO TABS: 25.0000 mg | ORAL_TABLET | Freq: Four times a day (QID) | ORAL | 0 refills | Status: AC | PRN

## 2021-09-06 MED ORDER — TRIAMCINOLONE ACETONIDE 40 MG/ML IJ SUSP
40.0000 mg | Freq: Once | INTRAMUSCULAR | Status: AC
  Administered 2021-09-06: 40 mg via INTRAMUSCULAR

## 2021-09-06 NOTE — ED Provider Notes (Signed)
[facility]    CSN: [id number] Arrival date & time: 09/06/21  0901      History   Chief Complaint Chief Complaint  Patient presents with   Insect Bite    HPI Jaime Lindsey is a 81 y.o. female.   Patient states that while outside doing yard work yesterday, more specifically she was picking blueberries from a blueberry bush in her yard, she believes she was bitten by a spider or an insect.  Patient states she believes there are several bites on the anterior aspect of her right forearm and initially noticed some swelling similar to that she usually gets when she has a mosquito bite.  Patient states the area was very itchy so she decided to put order cortisone cream on the lesion and cover it with a Kleenex last night, states her husband helped her adhere the clinics to her skin using some tape to keep the hydrocortisone from rubbing off.  Patient states that this morning she noticed that the lesion was significantly larger and also believes that since waking up this morning, it is continued to grow.  Patient has not marked the lesion as of yet.  Patient states has not tried any other remedies for her itching or swelling.  Patient states the area is not painful.  The history is provided by the patient.    Past Medical History:  Diagnosis Date   Colonic polyp    Hemorrhoids     Patient Active Problem List   Diagnosis Date Noted   Fatigue 10/08/2017   Sinus bradycardia on ECG 10/08/2017   Essential hypertension 10/08/2017   IRRITABLE BOWEL SYNDROME 07/15/2009   ARTHRITIS 07/15/2009   INTERNAL HEMORRHOIDS 07/09/2009    Past Surgical History:  Procedure Laterality Date   ABDOMINAL HYSTERECTOMY  1998   CATARACT EXTRACTION     CESAREAN SECTION      OB History   No obstetric history on file.      Home Medications    Prior to Admission medications   Medication Sig Start Date End Date Taking? Authorizing Provider  diphenhydrAMINE (BENADRYL) 25 MG tablet Take 1-2  tablets (25-50 mg total) by mouth every 6 (six) hours as needed for itching. 09/06/21  Yes Lynden Oxford Scales, PA-C  sulfamethoxazole-trimethoprim (BACTRIM DS) 800-160 MG tablet Take 1 tablet by mouth 2 (two) times daily for 5 days. 09/06/21 09/11/21 Yes Lynden Oxford Scales, PA-C  atorvastatin (LIPITOR) 10 MG tablet Take 10 mg by mouth daily. 07/30/21   [provider]  Calcium Carbonate-Vit D-Min (CALCIUM 1200 PO) Take 1 tablet by mouth daily.    [provider]  Cholecalciferol (VITAMIN D3) 2000 units TABS Take 1 tablet by mouth daily.    [provider]  estradiol (ESTRACE) 0.5 MG tablet Take 1 mg by mouth daily.  08/17/17   [provider]  Ferrous Sulfate (IRON) 325 (65 Fe) MG TABS Take 1 tablet by mouth daily.    [provider]  Inulin (FIBER CHOICE PO) Take 1 tablet by mouth daily.    [provider]  Multiple Vitamin (MULTIVITAMIN) tablet Take 1 tablet by mouth daily.    [provider]  triamterene-hydrochlorothiazide (DYAZIDE) 37.5-25 MG capsule Take 1 capsule by mouth daily. 08/29/17   [provider]  vitamin B-12 (CYANOCOBALAMIN) 1000 MCG tablet Take 1,000 mcg by mouth daily.    [provider]    Family History Family History  Problem Relation Age of Onset   CAD Father 67  CABG in his 69's    Social History Social History   Tobacco Use   Smoking status: Never   Smokeless tobacco: Never  Substance Use Topics   Drug use: Never     Allergies   Patient has no known allergies.   Review of Systems Review of Systems   Physical Exam Triage Vital Signs ED Triage Vitals  Enc Vitals Group     BP 09/06/21 0917 117/78     Pulse Rate 09/06/21 0917 68     Resp 09/06/21 0917 16     Temp 09/06/21 0917 98 F (36.7 C)     Temp Source 09/06/21 0917 Oral     SpO2 09/06/21 0917 95 %     Weight --      Height --      Head Circumference --      Peak Flow --      Pain Score 09/06/21 0913 0      Pain Loc --      Pain Edu? --      Excl. in [place]? --    No data found.  Updated Vital Signs BP 117/78 (BP Location: Right Arm)   Pulse 68   Temp 98 F (36.7 C) (Oral)   Resp 16   SpO2 95%   Visual Acuity Right Eye Distance:   Left Eye Distance:   Bilateral Distance:    Right Eye Near:   Left Eye Near:    Bilateral Near:     Physical Exam Vitals and nursing note reviewed.  Constitutional:      General: She is not in acute distress.    Appearance: Normal appearance.  HENT:     Head: Normocephalic and atraumatic.  Eyes:     Pupils: Pupils are equal, round, and reactive to light.  Cardiovascular:     Rate and Rhythm: Normal rate and regular rhythm.  Pulmonary:     Effort: Pulmonary effort is normal.     Breath sounds: Normal breath sounds.  Musculoskeletal:        General: Normal range of motion.     Cervical back: Normal range of motion and neck supple.  Skin:    General: Skin is warm and dry.     Findings: Lesion (See photo below.  Area is warm to touch.) present.  Neurological:     General: No focal deficit present.     Mental Status: She is alert and oriented to person, place, and time. Mental status is at baseline.  Psychiatric:        Mood and Affect: Mood normal.        Behavior: Behavior normal.        Thought Content: Thought content normal.        Judgment: Judgment normal.       UC Treatments / Results  Labs (all labs ordered are listed, but only abnormal results are displayed) Labs Reviewed - No data to display  EKG   Radiology No results found.  Procedures Procedures (including critical care time)  Medications Ordered in UC Medications  triamcinolone acetonide (KENALOG-40) injection 40 mg (has no administration in time range)    Initial Impression / Assessment and Plan / UC Course  I have reviewed the triage vital signs and the nursing notes.  Pertinent labs & imaging results that were available during my care of the patient  were reviewed by me and considered in my medical decision making (see chart for details).    Patient was  provided with an injection of Kenalog during her visit today.  Patient was advised to begin Bactrim and due to it being early in the day was further advised to try to take her first 2 doses 8 hours apart before the days out.  Patient also advised to begin Benadryl 50 mg for 1 or 2 doses every 6 hours then decrease to 1 capsule every 6 hours as needed.  Patient was advised to report to the emergency room if, despite treatment, the lesion began to grow in size outside of the lines placed around the lesion at this time.  Patient verbalized understanding.  Final Clinical Impressions(s) / UC Diagnoses   Final diagnoses:  Insect bite of left forearm, initial encounter  Cellulitis of other specified site     Discharge Instructions      You received an injection of Kenalog during your visit today which did significantly reduce the inflammation of the skin on your right forearm.    Please also pick up prescription for Bactrim, 1 tablet twice daily for 5 days.  If you can try to take your first 2 doses today, at least 8 hours apart, that would be ideal.  I have also provided you with a prescription for Benadryl you can certainly purchase over-the-counter if this is more affordable.  Please consider taking 2 tablets every 6 hours for at least the first dose and possibly your second dose then decrease to 1 tablet every 6 hours as needed based on how well the lesion on your forearm is resolving.  Please monitor your progress using the line we drew around the lesion on your arm.  If it increases despite treatment, I recommend that you go to the emergency room for further evaluation and more aggressive treatment.  Thank you for visiting urgent care today.   ED Prescriptions     Medication Sig Dispense Auth. Provider   sulfamethoxazole-trimethoprim (BACTRIM DS) 800-160 MG tablet Take 1 tablet by  mouth 2 (two) times daily for 5 days. 10 tablet Lynden Oxford Scales, PA-C   diphenhydrAMINE (BENADRYL) 25 MG tablet Take 1-2 tablets (25-50 mg total) by mouth every 6 (six) hours as needed for itching. 30 tablet Lynden Oxford Scales, PA-C      PDMP not reviewed this encounter.   Lynden Oxford Scales, [place] 09/06/21 [phone]

## 2021-09-06 NOTE — ED Triage Notes (Signed)
Pt states while doing yard work yesterday she got bitten (possibly from a spider). She now has a circle of swelling to her right forearm, and the patient c/o itchiness to the area.  Home interventions: hydrocortisone

## 2021-09-06 NOTE — Discharge Instructions (Signed)
You received an injection of Kenalog during your visit today which did significantly reduce the inflammation of the skin on your right forearm.    Please also pick up prescription for Bactrim, 1 tablet twice daily for 5 days.  If you can try to take your first 2 doses today, at least 8 hours apart, that would be ideal.  I have also provided you with a prescription for Benadryl you can certainly purchase over-the-counter if this is more affordable.  Please consider taking 2 tablets every 6 hours for at least the first dose and possibly your second dose then decrease to 1 tablet every 6 hours as needed based on how well the lesion on your forearm is resolving.  Please monitor your progress using the line we drew around the lesion on your arm.  If it increases despite treatment, I recommend that you go to the emergency room for further evaluation and more aggressive treatment.  Thank you for visiting urgent care today.

## 2021-09-27 DIAGNOSIS — C44319 Basal cell carcinoma of skin of other parts of face: Secondary | ICD-10-CM | POA: Diagnosis not present

## 2021-09-27 DIAGNOSIS — Z85828 Personal history of other malignant neoplasm of skin: Secondary | ICD-10-CM | POA: Diagnosis not present

## 2021-09-27 DIAGNOSIS — C44311 Basal cell carcinoma of skin of nose: Secondary | ICD-10-CM | POA: Diagnosis not present

## 2021-09-27 DIAGNOSIS — L282 Other prurigo: Secondary | ICD-10-CM | POA: Diagnosis not present

## 2021-09-27 DIAGNOSIS — C44612 Basal cell carcinoma of skin of right upper limb, including shoulder: Secondary | ICD-10-CM | POA: Diagnosis not present

## 2021-09-27 DIAGNOSIS — L570 Actinic keratosis: Secondary | ICD-10-CM | POA: Diagnosis not present

## 2021-10-26 DIAGNOSIS — Z1231 Encounter for screening mammogram for malignant neoplasm of breast: Secondary | ICD-10-CM | POA: Diagnosis not present

## 2021-11-01 ENCOUNTER — Other Ambulatory Visit: Payer: Self-pay | Admitting: Obstetrics and Gynecology

## 2021-11-01 DIAGNOSIS — R928 Other abnormal and inconclusive findings on diagnostic imaging of breast: Secondary | ICD-10-CM

## 2021-11-09 ENCOUNTER — Ambulatory Visit
Admission: RE | Admit: 2021-11-09 | Discharge: 2021-11-09 | Disposition: A | Discharge status: Home / Self Care | Payer: Medicare Other | Source: Ambulatory Visit | Attending: Obstetrics and Gynecology | Admitting: Obstetrics and Gynecology

## 2021-11-09 DIAGNOSIS — R922 Inconclusive mammogram: Secondary | ICD-10-CM | POA: Diagnosis not present

## 2021-11-09 DIAGNOSIS — R928 Other abnormal and inconclusive findings on diagnostic imaging of breast: Secondary | ICD-10-CM

## 2021-12-05 DIAGNOSIS — C4401 Basal cell carcinoma of skin of lip: Secondary | ICD-10-CM | POA: Diagnosis not present

## 2021-12-06 DIAGNOSIS — H43811 Vitreous degeneration, right eye: Secondary | ICD-10-CM | POA: Diagnosis not present

## 2021-12-06 DIAGNOSIS — H30042 Focal chorioretinal inflammation, macular or paramacular, left eye: Secondary | ICD-10-CM | POA: Diagnosis not present

## 2021-12-06 DIAGNOSIS — H35371 Puckering of macula, right eye: Secondary | ICD-10-CM | POA: Diagnosis not present

## 2021-12-06 DIAGNOSIS — H26493 Other secondary cataract, bilateral: Secondary | ICD-10-CM | POA: Diagnosis not present

## 2021-12-28 DIAGNOSIS — L821 Other seborrheic keratosis: Secondary | ICD-10-CM | POA: Diagnosis not present

## 2021-12-28 DIAGNOSIS — L720 Epidermal cyst: Secondary | ICD-10-CM | POA: Diagnosis not present

## 2021-12-28 DIAGNOSIS — Z85828 Personal history of other malignant neoplasm of skin: Secondary | ICD-10-CM | POA: Diagnosis not present

## 2022-01-09 DIAGNOSIS — K648 Other hemorrhoids: Secondary | ICD-10-CM | POA: Diagnosis not present

## 2022-01-09 DIAGNOSIS — K5730 Diverticulosis of large intestine without perforation or abscess without bleeding: Secondary | ICD-10-CM | POA: Diagnosis not present

## 2022-01-09 DIAGNOSIS — K6389 Other specified diseases of intestine: Secondary | ICD-10-CM | POA: Diagnosis not present

## 2022-01-09 DIAGNOSIS — K635 Polyp of colon: Secondary | ICD-10-CM | POA: Diagnosis not present

## 2022-01-09 DIAGNOSIS — D125 Benign neoplasm of sigmoid colon: Secondary | ICD-10-CM | POA: Diagnosis not present

## 2022-01-09 DIAGNOSIS — Z86010 Personal history of colonic polyps: Secondary | ICD-10-CM | POA: Diagnosis not present

## 2022-01-09 DIAGNOSIS — Z1211 Encounter for screening for malignant neoplasm of colon: Secondary | ICD-10-CM | POA: Diagnosis not present

## 2022-02-24 DIAGNOSIS — I129 Hypertensive chronic kidney disease with stage 1 through stage 4 chronic kidney disease, or unspecified chronic kidney disease: Secondary | ICD-10-CM | POA: Diagnosis not present

## 2022-02-24 DIAGNOSIS — N1831 Chronic kidney disease, stage 3a: Secondary | ICD-10-CM | POA: Diagnosis not present

## 2022-02-24 DIAGNOSIS — E7800 Pure hypercholesterolemia, unspecified: Secondary | ICD-10-CM | POA: Diagnosis not present

## 2022-02-24 DIAGNOSIS — M8580 Other specified disorders of bone density and structure, unspecified site: Secondary | ICD-10-CM | POA: Diagnosis not present

## 2022-02-24 DIAGNOSIS — R0981 Nasal congestion: Secondary | ICD-10-CM | POA: Diagnosis not present

## 2022-02-24 DIAGNOSIS — J069 Acute upper respiratory infection, unspecified: Secondary | ICD-10-CM | POA: Diagnosis not present

## 2022-04-14 DIAGNOSIS — H30042 Focal chorioretinal inflammation, macular or paramacular, left eye: Secondary | ICD-10-CM | POA: Diagnosis not present

## 2022-04-14 DIAGNOSIS — H35371 Puckering of macula, right eye: Secondary | ICD-10-CM | POA: Diagnosis not present

## 2022-04-14 DIAGNOSIS — H43811 Vitreous degeneration, right eye: Secondary | ICD-10-CM | POA: Diagnosis not present

## 2022-07-25 DIAGNOSIS — H353130 Nonexudative age-related macular degeneration, bilateral, stage unspecified: Secondary | ICD-10-CM | POA: Diagnosis not present

## 2022-09-04 DIAGNOSIS — Z0000 Encounter for general adult medical examination without abnormal findings: Secondary | ICD-10-CM | POA: Diagnosis not present

## 2022-09-18 DIAGNOSIS — I129 Hypertensive chronic kidney disease with stage 1 through stage 4 chronic kidney disease, or unspecified chronic kidney disease: Secondary | ICD-10-CM | POA: Diagnosis not present

## 2022-09-18 DIAGNOSIS — N1831 Chronic kidney disease, stage 3a: Secondary | ICD-10-CM | POA: Diagnosis not present

## 2022-09-18 DIAGNOSIS — E7800 Pure hypercholesterolemia, unspecified: Secondary | ICD-10-CM | POA: Diagnosis not present

## 2022-09-21 DIAGNOSIS — H18413 Arcus senilis, bilateral: Secondary | ICD-10-CM | POA: Diagnosis not present

## 2022-09-21 DIAGNOSIS — H26493 Other secondary cataract, bilateral: Secondary | ICD-10-CM | POA: Diagnosis not present

## 2022-09-21 DIAGNOSIS — H353131 Nonexudative age-related macular degeneration, bilateral, early dry stage: Secondary | ICD-10-CM | POA: Diagnosis not present

## 2022-09-21 DIAGNOSIS — H26491 Other secondary cataract, right eye: Secondary | ICD-10-CM | POA: Diagnosis not present

## 2022-09-21 DIAGNOSIS — Z961 Presence of intraocular lens: Secondary | ICD-10-CM | POA: Diagnosis not present

## 2022-09-21 DIAGNOSIS — H35372 Puckering of macula, left eye: Secondary | ICD-10-CM | POA: Diagnosis not present

## 2022-10-03 DIAGNOSIS — Z85828 Personal history of other malignant neoplasm of skin: Secondary | ICD-10-CM | POA: Diagnosis not present

## 2022-10-03 DIAGNOSIS — D225 Melanocytic nevi of trunk: Secondary | ICD-10-CM | POA: Diagnosis not present

## 2022-10-03 DIAGNOSIS — D1801 Hemangioma of skin and subcutaneous tissue: Secondary | ICD-10-CM | POA: Diagnosis not present

## 2022-10-03 DIAGNOSIS — D485 Neoplasm of uncertain behavior of skin: Secondary | ICD-10-CM | POA: Diagnosis not present

## 2022-10-03 DIAGNOSIS — L821 Other seborrheic keratosis: Secondary | ICD-10-CM | POA: Diagnosis not present

## 2022-10-03 DIAGNOSIS — D2239 Melanocytic nevi of other parts of face: Secondary | ICD-10-CM | POA: Diagnosis not present

## 2022-10-03 DIAGNOSIS — I788 Other diseases of capillaries: Secondary | ICD-10-CM | POA: Diagnosis not present

## 2022-10-17 DIAGNOSIS — H04123 Dry eye syndrome of bilateral lacrimal glands: Secondary | ICD-10-CM | POA: Diagnosis not present

## 2022-10-17 DIAGNOSIS — H43811 Vitreous degeneration, right eye: Secondary | ICD-10-CM | POA: Diagnosis not present

## 2022-10-17 DIAGNOSIS — H35371 Puckering of macula, right eye: Secondary | ICD-10-CM | POA: Diagnosis not present

## 2022-10-17 DIAGNOSIS — H30042 Focal chorioretinal inflammation, macular or paramacular, left eye: Secondary | ICD-10-CM | POA: Diagnosis not present

## 2022-11-09 DIAGNOSIS — Z1231 Encounter for screening mammogram for malignant neoplasm of breast: Secondary | ICD-10-CM | POA: Diagnosis not present

## 2023-02-06 DIAGNOSIS — L570 Actinic keratosis: Secondary | ICD-10-CM | POA: Diagnosis not present

## 2023-03-22 DIAGNOSIS — I129 Hypertensive chronic kidney disease with stage 1 through stage 4 chronic kidney disease, or unspecified chronic kidney disease: Secondary | ICD-10-CM | POA: Diagnosis not present

## 2023-03-22 DIAGNOSIS — N1831 Chronic kidney disease, stage 3a: Secondary | ICD-10-CM | POA: Diagnosis not present

## 2023-03-22 DIAGNOSIS — R202 Paresthesia of skin: Secondary | ICD-10-CM | POA: Diagnosis not present

## 2023-03-22 DIAGNOSIS — Z6824 Body mass index (BMI) 24.0-24.9, adult: Secondary | ICD-10-CM | POA: Diagnosis not present

## 2023-03-22 DIAGNOSIS — E7800 Pure hypercholesterolemia, unspecified: Secondary | ICD-10-CM | POA: Diagnosis not present

## 2023-04-11 DIAGNOSIS — S0300XA Dislocation of jaw, unspecified side, initial encounter: Secondary | ICD-10-CM | POA: Diagnosis not present

## 2023-04-11 DIAGNOSIS — S39012A Strain of muscle, fascia and tendon of lower back, initial encounter: Secondary | ICD-10-CM | POA: Diagnosis not present

## 2023-05-16 DIAGNOSIS — H35371 Puckering of macula, right eye: Secondary | ICD-10-CM | POA: Diagnosis not present

## 2023-05-16 DIAGNOSIS — H30042 Focal chorioretinal inflammation, macular or paramacular, left eye: Secondary | ICD-10-CM | POA: Diagnosis not present

## 2023-05-16 DIAGNOSIS — H04123 Dry eye syndrome of bilateral lacrimal glands: Secondary | ICD-10-CM | POA: Diagnosis not present

## 2023-05-16 DIAGNOSIS — H43811 Vitreous degeneration, right eye: Secondary | ICD-10-CM | POA: Diagnosis not present

## 2023-07-24 DIAGNOSIS — M8588 Other specified disorders of bone density and structure, other site: Secondary | ICD-10-CM | POA: Diagnosis not present

## 2023-09-10 DIAGNOSIS — W57XXXA Bitten or stung by nonvenomous insect and other nonvenomous arthropods, initial encounter: Secondary | ICD-10-CM | POA: Diagnosis not present

## 2023-09-10 DIAGNOSIS — S80861A Insect bite (nonvenomous), right lower leg, initial encounter: Secondary | ICD-10-CM | POA: Diagnosis not present

## 2023-09-10 DIAGNOSIS — L089 Local infection of the skin and subcutaneous tissue, unspecified: Secondary | ICD-10-CM | POA: Diagnosis not present

## 2023-10-08 DIAGNOSIS — Z0000 Encounter for general adult medical examination without abnormal findings: Secondary | ICD-10-CM | POA: Diagnosis not present

## 2023-10-08 DIAGNOSIS — Z23 Encounter for immunization: Secondary | ICD-10-CM | POA: Diagnosis not present

## 2023-11-19 DIAGNOSIS — Z1231 Encounter for screening mammogram for malignant neoplasm of breast: Secondary | ICD-10-CM | POA: Diagnosis not present

## 2023-11-22 ENCOUNTER — Emergency Department (HOSPITAL_BASED_OUTPATIENT_CLINIC_OR_DEPARTMENT_OTHER)
Admission: EM | Admit: 2023-11-22 | Discharge: 2023-11-22 | Disposition: A | Discharge status: Home / Self Care | Source: Ambulatory Visit | Attending: Emergency Medicine | Admitting: Emergency Medicine

## 2023-11-22 ENCOUNTER — Emergency Department (HOSPITAL_BASED_OUTPATIENT_CLINIC_OR_DEPARTMENT_OTHER)

## 2023-11-22 ENCOUNTER — Emergency Department (HOSPITAL_BASED_OUTPATIENT_CLINIC_OR_DEPARTMENT_OTHER): Admitting: Radiology

## 2023-11-22 DIAGNOSIS — M1712 Unilateral primary osteoarthritis, left knee: Secondary | ICD-10-CM | POA: Diagnosis not present

## 2023-11-22 DIAGNOSIS — I10 Essential (primary) hypertension: Secondary | ICD-10-CM | POA: Insufficient documentation

## 2023-11-22 DIAGNOSIS — S8002XA Contusion of left knee, initial encounter: Secondary | ICD-10-CM | POA: Diagnosis not present

## 2023-11-22 DIAGNOSIS — M47816 Spondylosis without myelopathy or radiculopathy, lumbar region: Secondary | ICD-10-CM | POA: Diagnosis not present

## 2023-11-22 DIAGNOSIS — W19XXXA Unspecified fall, initial encounter: Secondary | ICD-10-CM

## 2023-11-22 DIAGNOSIS — S022XXA Fracture of nasal bones, initial encounter for closed fracture: Secondary | ICD-10-CM | POA: Insufficient documentation

## 2023-11-22 DIAGNOSIS — M47812 Spondylosis without myelopathy or radiculopathy, cervical region: Secondary | ICD-10-CM | POA: Diagnosis not present

## 2023-11-22 DIAGNOSIS — S0992XA Unspecified injury of nose, initial encounter: Secondary | ICD-10-CM | POA: Diagnosis present

## 2023-11-22 DIAGNOSIS — S60512A Abrasion of left hand, initial encounter: Secondary | ICD-10-CM | POA: Diagnosis not present

## 2023-11-22 DIAGNOSIS — Y9302 Activity, running: Secondary | ICD-10-CM | POA: Insufficient documentation

## 2023-11-22 DIAGNOSIS — S60511A Abrasion of right hand, initial encounter: Secondary | ICD-10-CM | POA: Insufficient documentation

## 2023-11-22 DIAGNOSIS — M25552 Pain in left hip: Secondary | ICD-10-CM | POA: Diagnosis not present

## 2023-11-22 DIAGNOSIS — Y92480 Sidewalk as the place of occurrence of the external cause: Secondary | ICD-10-CM | POA: Diagnosis not present

## 2023-11-22 DIAGNOSIS — M4802 Spinal stenosis, cervical region: Secondary | ICD-10-CM | POA: Diagnosis not present

## 2023-11-22 DIAGNOSIS — M5021 Other cervical disc displacement,  high cervical region: Secondary | ICD-10-CM | POA: Diagnosis not present

## 2023-11-22 DIAGNOSIS — W01198A Fall on same level from slipping, tripping and stumbling with subsequent striking against other object, initial encounter: Secondary | ICD-10-CM | POA: Diagnosis not present

## 2023-11-22 MED ORDER — ACETAMINOPHEN 500 MG PO TABS
1000.0000 mg | ORAL_TABLET | Freq: Once | ORAL | Status: AC
  Administered 2023-11-22: 1000 mg via ORAL
  Filled 2023-11-22: qty 2

## 2023-11-22 NOTE — ED Provider Notes (Addendum)
 [place] EMERGENCY DEPARTMENT AT [facility] Provider Note   CSN: [id number] Arrival date & time: 11/22/23  1254     Patient presents with: Jaime Lindsey is a 83 y.o. female.   Pt is a fairly healthy 82y/o female with hx of htn who is presenting today after a fall.  Pt was moving into a new place and the cart she was using ran away from her and she was running to get it when she tripped and fell face first into the pavement and hit her face and hands and left knee on the ground.  No LOC but immediate bleeding to the nose.  She denies N/V or vision changes.  Having pain in the hands, left hip and knee.  She is able to walk but uncomfortable.  No anticoagulation.  She denies new neck pain or HA.  No dental complaints  The history is provided by the patient, the spouse and medical records.  Fall       Prior to Admission medications   Medication Sig Start Date End Date Taking? Authorizing Provider  atorvastatin (LIPITOR) 10 MG tablet Take 10 mg by mouth daily. 07/30/21   [provider]  Calcium Carbonate-Vit D-Min (CALCIUM 1200 PO) Take 1 tablet by mouth daily.    [provider]  Cholecalciferol (VITAMIN D3) 2000 units TABS Take 1 tablet by mouth daily.    [provider]  diphenhydrAMINE  (BENADRYL ) 25 MG tablet Take 1-2 tablets (25-50 mg total) by mouth every 6 (six) hours as needed for itching. 09/06/21   Joesph Shaver Scales, PA-C  estradiol (ESTRACE) 0.5 MG tablet Take 1 mg by mouth daily.  08/17/17   [provider]  Ferrous Sulfate (IRON) 325 (65 Fe) MG TABS Take 1 tablet by mouth daily.    [provider]  Inulin (FIBER CHOICE PO) Take 1 tablet by mouth daily.    [provider]  Multiple Vitamin (MULTIVITAMIN) tablet Take 1 tablet by mouth daily.    [provider]  triamterene-hydrochlorothiazide (DYAZIDE) 37.5-25 MG capsule Take 1 capsule by mouth daily. 08/29/17   [provider]  vitamin B-12  (CYANOCOBALAMIN) 1000 MCG tablet Take 1,000 mcg by mouth daily.    [provider]    Allergies: Patient has no known allergies.    Review of Systems  Updated Vital Signs BP (!) 175/75 (BP Location: Left Arm)   Pulse 66   Temp 97.9 F (36.6 C) (Oral)   Resp 17   Ht 5' 1.5 (1.562 m)   Wt 60.3 kg   SpO2 99%   BMI 24.72 kg/m   Physical Exam Vitals and nursing note reviewed.  Constitutional:      General: She is not in acute distress.    Appearance: She is well-developed.  HENT:     Head: Normocephalic and atraumatic.      Comments: Significant contusion, ecchymosis and swelling over the bridge of the nose.      Right Ear: Tympanic membrane normal.     Left Ear: Tympanic membrane normal.     Nose: Signs of injury, nasal tenderness, mucosal edema and congestion present.     Right Turbinates: Swollen.     Left Turbinates: Swollen.     Comments: No septal hematoma noted    Mouth/Throat:     Pharynx: Oropharynx is clear.  Eyes:     Conjunctiva/sclera: Conjunctivae normal.     Pupils: Pupils are equal, round, and reactive to light.  Cardiovascular:  Rate and Rhythm: Normal rate and regular rhythm.     Heart sounds: No murmur heard. Pulmonary:     Effort: Pulmonary effort is normal. No respiratory distress.     Breath sounds: Normal breath sounds. No wheezing or rales.  Abdominal:     General: There is no distension.     Palpations: Abdomen is soft.     Tenderness: There is no abdominal tenderness. There is no guarding or rebound.  Musculoskeletal:        General: Tenderness present. Normal range of motion.       Hands:     Cervical back: Normal range of motion and neck supple. No tenderness.     Left hip: Tenderness and bony tenderness present. Normal range of motion.       Legs:  Skin:    General: Skin is warm and dry.     Findings: No erythema or rash.  Neurological:     Mental Status: She is alert and oriented to person, place, and time. Mental  status is at baseline.  Psychiatric:        Behavior: Behavior normal.     (all labs ordered are listed, but only abnormal results are displayed) Labs Reviewed - No data to display  EKG: None  Radiology: CT Head Wo Contrast Result Date: 11/22/2023 CLINICAL DATA:  Provided history: Fall. Additional history provided: Mechanical fall while running (with facial trauma), nasal deformity, facial pain. EXAM: CT HEAD WITHOUT CONTRAST CT MAXILLOFACIAL WITHOUT CONTRAST CT CERVICAL SPINE WITHOUT CONTRAST TECHNIQUE: Multidetector CT imaging of the head, cervical spine, and maxillofacial structures were performed using the standard protocol without intravenous contrast. Multiplanar CT image reconstructions of the cervical spine and maxillofacial structures were also generated. RADIATION DOSE REDUCTION: This exam was performed according to the departmental dose-optimization program which includes automated exposure control, adjustment of the mA and/or kV according to patient size and/or use of iterative reconstruction technique. COMPARISON:  None. FINDINGS: CT HEAD FINDINGS Brain: Mild generalized cerebral atrophy. There is no acute intracranial hemorrhage. No demarcated cortical infarct. No extra-axial fluid collection. No evidence of an intracranial mass. No midline shift. Vascular: No hyperdense vessel. Atherosclerotic calcifications. Skull: No calvarial fracture or aggressive osseous lesion. CT MAXILLOFACIAL FINDINGS Osseous: Acute, displaced bilateral nasal bone fractures. No other acute maxillofacial fracture is identified. Orbits: No acute orbital finding. Sinuses: No significant inflammatory paranasal sinus disease. Soft tissues: Nasal and nasal septal soft tissue swelling. Small hyperdense foci within the nasal soft tissues at midline suspicious for embedded foreign bodies (series 4, image 38). Small focus of subcutaneous gas along the right nasal bone. Other: Bilateral temporomandibular joint arthropathy.  CT CERVICAL SPINE FINDINGS Alignment: Levocurvature of the cervical spine. Nonspecific reversal of the expected cervical lordosis. 2 mm C2-C3 grade 1 anterolisthesis. 2 mm C3-C4 grade 1 retrolisthesis. 2 mm C4-C5 grade 1 anterolisthesis. Skull base and vertebrae: The basion-dental and atlanto-dental intervals are maintained.No evidence of acute fracture to the cervical spine. 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 Soft tissues and spinal canal: No prevertebral fluid or swelling. No visible canal hematoma. Disc levels: Cervical spondylosis with multilevel disc space narrowing, disc bulges/central disc protrusions, posterior disc osteophyte complexes, endplate spurring, uncovertebral hypertrophy and facet arthropathy. Disc space narrowing is advanced at C3-C4, C4-C5, C5-C6, C6-C7 and C7-T1. No appreciable high-grade spinal canal stenosis. Multilevel bony neural foraminal narrowing. Multilevel ventral osteophytes. Upper chest: No consolidation within the imaged lung apices. No visible pneumothorax. IMPRESSION: CT head: 1.  No evidence of an acute  intracranial abnormality. 2. Mild generalized cerebral atrophy. CT cervical spine: 1. Acute, displaced bilateral nasal bone fractures (minimally displaced on the left, greater displacement on the right). 2. Associated nasal and nasal septal soft tissue swelling. 3. Small hyperdense foci within the nasal soft tissues at midline suspicious for embedded foreign bodies (see series 4, image 38). 4. Small focus of subcutaneous gas along the right nasal bone suggesting a laceration at this site. 5. Bilateral temporomandibular joint arthropathy. CT cervical spine: 1. No evidence of an acute cervical spine fracture. 2. Grade 1 spondylolisthesis at CT C3, C3-C4 and C4-C5. 3. Levocurvature of the cervical spine. 4. Nonspecific reversal of the expected cervical lordosis. 5. Cervical spondylosis as described. 6. 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 Electronically Signed   By: Rockey Childs D.O.   On: 11/22/2023 14:42   CT Maxillofacial Wo Contrast Result Date: 11/22/2023 CLINICAL DATA:  Provided history: Fall. Additional history provided: Mechanical fall while running (with facial trauma), nasal deformity, facial pain. EXAM: CT HEAD WITHOUT CONTRAST CT MAXILLOFACIAL WITHOUT CONTRAST CT CERVICAL SPINE WITHOUT CONTRAST TECHNIQUE: Multidetector CT imaging of the head, cervical spine, and maxillofacial structures were performed using the standard protocol without intravenous contrast. Multiplanar CT image reconstructions of the cervical spine and maxillofacial structures were also generated. RADIATION DOSE REDUCTION: This exam was performed according to the departmental dose-optimization program which includes automated exposure control, adjustment of the mA and/or kV according to patient size and/or use of iterative reconstruction technique. COMPARISON:  None. FINDINGS: CT HEAD FINDINGS Brain: Mild generalized cerebral atrophy. There is no acute intracranial hemorrhage. No demarcated cortical infarct. No extra-axial fluid collection. No evidence of an intracranial mass. No midline shift. Vascular: No hyperdense vessel. Atherosclerotic calcifications. Skull: No calvarial fracture or aggressive osseous lesion. CT MAXILLOFACIAL FINDINGS Osseous: Acute, displaced bilateral nasal bone fractures. No other acute maxillofacial fracture is identified. Orbits: No acute orbital finding. Sinuses: No significant inflammatory paranasal sinus disease. Soft tissues: Nasal and nasal septal soft tissue swelling. Small hyperdense foci within the nasal soft tissues at midline suspicious for embedded foreign bodies (series 4, image 38). Small focus of subcutaneous gas along the right nasal bone. Other: Bilateral temporomandibular joint arthropathy. CT CERVICAL SPINE FINDINGS Alignment: Levocurvature of the cervical spine. Nonspecific reversal of the expected cervical lordosis. 2 mm C2-C3 grade 1 anterolisthesis. 2  mm C3-C4 grade 1 retrolisthesis. 2 mm C4-C5 grade 1 anterolisthesis. Skull base and vertebrae: The basion-dental and atlanto-dental intervals are maintained.No evidence of acute fracture to the cervical spine. 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 Soft tissues and spinal canal: No prevertebral fluid or swelling. No visible canal hematoma. Disc levels: Cervical spondylosis with multilevel disc space narrowing, disc bulges/central disc protrusions, posterior disc osteophyte complexes, endplate spurring, uncovertebral hypertrophy and facet arthropathy. Disc space narrowing is advanced at C3-C4, C4-C5, C5-C6, C6-C7 and C7-T1. No appreciable high-grade spinal canal stenosis. Multilevel bony neural foraminal narrowing. Multilevel ventral osteophytes. Upper chest: No consolidation within the imaged lung apices. No visible pneumothorax. IMPRESSION: CT head: 1.  No evidence of an acute intracranial abnormality. 2. Mild generalized cerebral atrophy. CT cervical spine: 1. Acute, displaced bilateral nasal bone fractures (minimally displaced on the left, greater displacement on the right). 2. Associated nasal and nasal septal soft tissue swelling. 3. Small hyperdense foci within the nasal soft tissues at midline suspicious for embedded foreign bodies (see series 4, image 38). 4. Small focus of subcutaneous gas along the right nasal bone suggesting a laceration at this site. 5. Bilateral  temporomandibular joint arthropathy. CT cervical spine: 1. No evidence of an acute cervical spine fracture. 2. Grade 1 spondylolisthesis at CT C3, C3-C4 and C4-C5. 3. Levocurvature of the cervical spine. 4. Nonspecific reversal of the expected cervical lordosis. 5. Cervical spondylosis as described. 6. 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 Electronically Signed   By: Rockey Childs D.O.   On: 11/22/2023 14:42   CT Cervical Spine Wo Contrast Result Date: 11/22/2023 CLINICAL DATA:  Provided history: Fall. Additional history provided: Mechanical  fall while running (with facial trauma), nasal deformity, facial pain. EXAM: CT HEAD WITHOUT CONTRAST CT MAXILLOFACIAL WITHOUT CONTRAST CT CERVICAL SPINE WITHOUT CONTRAST TECHNIQUE: Multidetector CT imaging of the head, cervical spine, and maxillofacial structures were performed using the standard protocol without intravenous contrast. Multiplanar CT image reconstructions of the cervical spine and maxillofacial structures were also generated. RADIATION DOSE REDUCTION: This exam was performed according to the departmental dose-optimization program which includes automated exposure control, adjustment of the mA and/or kV according to patient size and/or use of iterative reconstruction technique. COMPARISON:  None. FINDINGS: CT HEAD FINDINGS Brain: Mild generalized cerebral atrophy. There is no acute intracranial hemorrhage. No demarcated cortical infarct. No extra-axial fluid collection. No evidence of an intracranial mass. No midline shift. Vascular: No hyperdense vessel. Atherosclerotic calcifications. Skull: No calvarial fracture or aggressive osseous lesion. CT MAXILLOFACIAL FINDINGS Osseous: Acute, displaced bilateral nasal bone fractures. No other acute maxillofacial fracture is identified. Orbits: No acute orbital finding. Sinuses: No significant inflammatory paranasal sinus disease. Soft tissues: Nasal and nasal septal soft tissue swelling. Small hyperdense foci within the nasal soft tissues at midline suspicious for embedded foreign bodies (series 4, image 38). Small focus of subcutaneous gas along the right nasal bone. Other: Bilateral temporomandibular joint arthropathy. CT CERVICAL SPINE FINDINGS Alignment: Levocurvature of the cervical spine. Nonspecific reversal of the expected cervical lordosis. 2 mm C2-C3 grade 1 anterolisthesis. 2 mm C3-C4 grade 1 retrolisthesis. 2 mm C4-C5 grade 1 anterolisthesis. Skull base and vertebrae: The basion-dental and atlanto-dental intervals are maintained.No evidence of  acute fracture to the cervical spine. 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 Soft tissues and spinal canal: No prevertebral fluid or swelling. No visible canal hematoma. Disc levels: Cervical spondylosis with multilevel disc space narrowing, disc bulges/central disc protrusions, posterior disc osteophyte complexes, endplate spurring, uncovertebral hypertrophy and facet arthropathy. Disc space narrowing is advanced at C3-C4, C4-C5, C5-C6, C6-C7 and C7-T1. No appreciable high-grade spinal canal stenosis. Multilevel bony neural foraminal narrowing. Multilevel ventral osteophytes. Upper chest: No consolidation within the imaged lung apices. No visible pneumothorax. IMPRESSION: CT head: 1.  No evidence of an acute intracranial abnormality. 2. Mild generalized cerebral atrophy. CT cervical spine: 1. Acute, displaced bilateral nasal bone fractures (minimally displaced on the left, greater displacement on the right). 2. Associated nasal and nasal septal soft tissue swelling. 3. Small hyperdense foci within the nasal soft tissues at midline suspicious for embedded foreign bodies (see series 4, image 38). 4. Small focus of subcutaneous gas along the right nasal bone suggesting a laceration at this site. 5. Bilateral temporomandibular joint arthropathy. CT cervical spine: 1. No evidence of an acute cervical spine fracture. 2. Grade 1 spondylolisthesis at CT C3, C3-C4 and C4-C5. 3. Levocurvature of the cervical spine. 4. Nonspecific reversal of the expected cervical lordosis. 5. Cervical spondylosis as described. 6. 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 Electronically Signed   By: Rockey Childs D.O.   On: 11/22/2023 14:42   DG Knee 2 Views Left Result Date:  11/22/2023 EXAM: 1 or 2 VIEW(S) XRAY OF THE LEFT KNEE 11/22/2023 02:01:00 PM COMPARISON: None available. CLINICAL HISTORY: Fall. Fall and now has left hip pain and left anterior knee bruising. FINDINGS: BONES AND JOINTS: No acute fracture. No focal osseous lesion. No  joint dislocation. No significant joint effusion. Mild medial compartment joint space narrowing and sharpening of the tibial spines. SOFT TISSUES: The soft tissues are unremarkable. IMPRESSION: 1. No acute fracture or dislocation. 2. Mild degenerative change Electronically signed by: Waddell Calk MD 11/22/2023 02:38 PM EDT RP Workstation: [id number]   DG Hip Unilat With Pelvis 2-3 Views Left Result Date: 11/22/2023 CLINICAL DATA:  Status post fall with left hip pain EXAM: DG HIP (WITH OR WITHOUT PELVIS) 3V LEFT COMPARISON:  None Available. FINDINGS: There is no evidence of hip fracture or dislocation. Degenerative changes of the partially imaged lumbar spine. IMPRESSION: No acute fracture or dislocation. Electronically Signed   By: Limin  Xu M.D.   On: 11/22/2023 14:36     Procedures   Medications Ordered in the ED  acetaminophen  (TYLENOL ) tablet 1,000 mg (1,000 mg Oral Given 11/22/23 1406)                                    Medical Decision Making Amount and/or Complexity of Data Reviewed Radiology: ordered and independent interpretation performed. Decision-making details documented in ED Course.  Risk OTC drugs.   Pt with multiple medical problems and comorbidities and presenting today with a complaint that caries a high risk for morbidity and mortality. Here today after fall and facial injury.  Pt has concern for nasal fracture but will r/o orbital fracture or head injury.  She is not anticoagulated and mentating normally.  Also having pain in the left leg.  Fall was  mechanical. I have independently visualized and interpreted pt's images today.  Head cT neg for bleed but facial CT with nasal bone fx. Cervical spine neg for fx.  Radiology reports Acute, displaced bilateral nasal bone fractures (minimally  displaced on the left, greater displacement on the right).  2. Associated nasal and nasal septal soft tissue swelling.  3. Small hyperdense foci within the nasal soft tissues at  midline  suspicious for embedded foreign bodies (see series 4, image 38).  4. Small focus of subcutaneous gas along the right nasal bone  suggesting a laceration at this site.   Pt has no lacerations to her nose and signs of FB.  Will have her use saline spray and tylenol  for pain.  Hip and knee imaging is neg.      Final diagnoses:  Fall, initial encounter  Closed fracture of nasal bone, initial encounter    ED Discharge Orders     None          Doretha Folks, MD 11/22/23 1549    Doretha Folks, MD 11/22/23 1549

## 2023-11-22 NOTE — ED Triage Notes (Signed)
 Mechanical fall while running. Hit face over concrete. Obvious deformity to nose. C/o face, left hip, and left knee pain. Denies LOC. Denies thinners.

## 2023-11-22 NOTE — Discharge Instructions (Addendum)
 No signs of internal bleeding in your head or broken bones in your spine.  You did break your nose.  They will be a lot of swelling related to this.  Try not to blow your nose but you can use saline nasal spray to help if you feel congestion as well as a little Vaseline on the inside of your nose.  Take extra strength Tylenol  as needed for pain and apply an ice pack no more than 20 minutes at a time to help with swelling.  It is not uncommon to be bruised and feel sore all over after a fall like this.

## 2023-11-26 DIAGNOSIS — Z23 Encounter for immunization: Secondary | ICD-10-CM | POA: Diagnosis not present

## 2023-12-05 DIAGNOSIS — N1831 Chronic kidney disease, stage 3a: Secondary | ICD-10-CM | POA: Diagnosis not present

## 2023-12-05 DIAGNOSIS — Z9181 History of falling: Secondary | ICD-10-CM | POA: Diagnosis not present

## 2023-12-05 DIAGNOSIS — E7800 Pure hypercholesterolemia, unspecified: Secondary | ICD-10-CM | POA: Diagnosis not present

## 2023-12-05 DIAGNOSIS — I129 Hypertensive chronic kidney disease with stage 1 through stage 4 chronic kidney disease, or unspecified chronic kidney disease: Secondary | ICD-10-CM | POA: Diagnosis not present
